# Patient Record
Sex: Male | Born: 1975 | Race: Black or African American | Hispanic: No | Marital: Married | Smoking: Never smoker
Health system: Southern US, Community
[De-identification: ages and names within clinical notes are randomized; demographics above are authoritative.]

## PROBLEM LIST (undated history)

## (undated) DIAGNOSIS — I10 Essential (primary) hypertension: Secondary | ICD-10-CM

## (undated) DIAGNOSIS — E785 Hyperlipidemia, unspecified: Secondary | ICD-10-CM

## (undated) DIAGNOSIS — E119 Type 2 diabetes mellitus without complications: Secondary | ICD-10-CM

## (undated) HISTORY — DX: Type 2 diabetes mellitus without complications: E11.9

## (undated) HISTORY — DX: Hyperlipidemia, unspecified: E78.5

## (undated) HISTORY — DX: Essential (primary) hypertension: I10

---

## 2015-02-05 ENCOUNTER — Encounter: Payer: Self-pay | Admitting: Internal Medicine

## 2015-03-27 ENCOUNTER — Encounter: Payer: Self-pay | Admitting: Endocrinology

## 2015-03-27 ENCOUNTER — Ambulatory Visit (INDEPENDENT_AMBULATORY_CARE_PROVIDER_SITE_OTHER): Payer: 59 | Admitting: Endocrinology

## 2015-03-27 VITALS — BP 122/84 | HR 92 | Temp 98.2°F | Ht 70.75 in | Wt 185.0 lb

## 2015-03-27 DIAGNOSIS — M5430 Sciatica, unspecified side: Secondary | ICD-10-CM | POA: Diagnosis not present

## 2015-03-27 DIAGNOSIS — I10 Essential (primary) hypertension: Secondary | ICD-10-CM

## 2015-03-27 DIAGNOSIS — Z794 Long term (current) use of insulin: Secondary | ICD-10-CM | POA: Diagnosis not present

## 2015-03-27 DIAGNOSIS — E785 Hyperlipidemia, unspecified: Secondary | ICD-10-CM | POA: Diagnosis not present

## 2015-03-27 DIAGNOSIS — E119 Type 2 diabetes mellitus without complications: Secondary | ICD-10-CM | POA: Diagnosis not present

## 2015-03-27 DIAGNOSIS — K219 Gastro-esophageal reflux disease without esophagitis: Secondary | ICD-10-CM

## 2015-03-27 LAB — POCT GLYCOSYLATED HEMOGLOBIN (HGB A1C): Hemoglobin A1C: 11.7

## 2015-03-27 MED ORDER — INSULIN GLARGINE 100 UNIT/ML SOLOSTAR PEN: 20.0000 [IU] | PEN_INJECTOR | SUBCUTANEOUS | Status: DC

## 2015-03-27 MED ORDER — GLUCOSE BLOOD VI STRP: 1.0000 | ORAL_STRIP | Freq: Two times a day (BID) | Status: DC

## 2015-03-27 NOTE — Progress Notes (Signed)
Subjective:    Patient ID: Anthony Arnold, male    DOB: 02-02-1976, 40 y.o.   MRN: [id number]  HPI pt states DM was dx'ed in 2014; he has mild if any neuropathy of the lower extremities; he is unaware of any associated chronic complications; he has never been on insulin; pt says his diet and exercise are not very good; he has never had pancreatitis, severe hypoglycemia or DKA.  He has not recently checked cbg.   No past medical history on file.  No past surgical history on file.  Social History   Social History  . Marital Status: Married    Spouse Name: N/A  . Number of Children: N/A  . Years of Education: N/A   Occupational History  . Not on file.   Social History Main Topics  . Smoking status: Never Smoker   . Smokeless tobacco: Not on file  . Alcohol Use: No  . Drug Use: Not on file  . Sexual Activity: Not on file   Other Topics Concern  . Not on file   Social History Narrative  . No narrative on file    No current outpatient prescriptions on file prior to visit.   No current facility-administered medications on file prior to visit.    No Known Allergies  Family History  Problem Relation Age of Onset  . Diabetes Mother   . Diabetes Brother     BP 122/84 mmHg  Pulse 92  Temp(Src) 98.2 F (36.8 C) (Oral)  Ht 5' 10.75" (1.797 m)  Wt 185 lb (83.915 kg)  BMI 25.99 kg/m2  SpO2 97%   Review of Systems denies weight loss, blurry vision, headache, chest pain, sob, n/v, muscle cramps, excessive diaphoresis, depression, cold intolerance, rhinorrhea, and easy bruising.   He has urinary frequency.       Objective:   Physical Exam VS: see vs page GEN: no distress HEAD: head: no deformity eyes: no periorbital swelling, no proptosis external nose and ears are normal mouth: no lesion seen NECK: supple, thyroid is not enlarged CHEST WALL: no deformity LUNGS: clear to auscultation BREASTS:  No gynecomastia CV: reg rate and rhythm, no murmur ABD: abdomen  is soft, nontender.  no hepatosplenomegaly.  not distended.  no hernia.   MUSCULOSKELETAL: muscle bulk and strength are grossly normal.  no obvious joint swelling.  gait is normal and steady EXTEMITIES: no deformity.  no ulcer on the feet.  feet are of normal color and temp.  no edema PULSES: dorsalis pedis intact bilat.  no carotid bruit NEURO:  cn 2-12 grossly intact.   readily moves all 4's.  sensation is intact to touch on the feet SKIN:  Normal texture and temperature.  No rash or suspicious lesion is visible.   NODES:  None palpable at the neck. PSYCH: alert, well-oriented.  Does not appear anxious nor depressed.   A1c=11.7%  I have reviewed outside records, and summarized: Pt was noted to have severely elevated a1c, and referred here.  i personally reviewed electrocardiogram tracing (today): Indication: DM Impression: normal     Assessment & Plan:  DM: severe exacerbation.  He agrees to start insulin.    Patient is advised the following: Patient Instructions  good diet and exercise significantly improve the control of your diabetes.  please let me know if you wish to be referred to a dietician.  high blood sugar is very risky to your health.  you should see an eye doctor and dentist every year.  It  is very important to get all recommended vaccinations.  controlling your blood pressure and cholesterol drastically reduces the damage diabetes does to your body.  Those who smoke should quit.  please discuss these with your doctor.  check your blood sugar twice a day.  vary the time of day when you check, between before the 3 meals, and at bedtime.  also check if you have symptoms of your blood sugar being too high or too low.  please keep a record of the readings and bring it to your next appointment here (or you can bring the meter itself).  You can write it on any piece of paper.  please call us sooner if your blood sugar goes below 70, or if you have a lot of readings over 200. i  have sent a prescription to your pharmacy, for the insulin.  Please call us in a few days, to tell us how the blood sugar is doing.   We'll refine the insulin schedule later.  Please come back for a follow-up appointment in 2 weeks.

## 2015-03-27 NOTE — Patient Instructions (Addendum)
good diet and exercise significantly improve the control of your diabetes.  please let me know if you wish to be referred to a dietician.  high blood sugar is very risky to your health.  you should see an eye doctor and dentist every year.  It is very important to get all recommended vaccinations.  controlling your blood pressure and cholesterol drastically reduces the damage diabetes does to your body.  Those who smoke should quit.  please discuss these with your doctor.  check your blood sugar twice a day.  vary the time of day when you check, between before the 3 meals, and at bedtime.  also check if you have symptoms of your blood sugar being too high or too low.  please keep a record of the readings and bring it to your next appointment here (or you can bring the meter itself).  You can write it on any piece of paper.  please call us sooner if your blood sugar goes below 70, or if you have a lot of readings over 200. i have sent a prescription to your pharmacy, for the insulin.  Please call us in a few days, to tell us how the blood sugar is doing.   We'll refine the insulin schedule later.  Please come back for a follow-up appointment in 2 weeks.

## 2015-03-28 DIAGNOSIS — I10 Essential (primary) hypertension: Secondary | ICD-10-CM | POA: Insufficient documentation

## 2015-03-28 DIAGNOSIS — M5430 Sciatica, unspecified side: Secondary | ICD-10-CM | POA: Insufficient documentation

## 2015-03-28 DIAGNOSIS — K219 Gastro-esophageal reflux disease without esophagitis: Secondary | ICD-10-CM | POA: Insufficient documentation

## 2015-03-28 DIAGNOSIS — E785 Hyperlipidemia, unspecified: Secondary | ICD-10-CM | POA: Insufficient documentation

## 2015-04-10 ENCOUNTER — Ambulatory Visit (INDEPENDENT_AMBULATORY_CARE_PROVIDER_SITE_OTHER): Payer: 59 | Admitting: Endocrinology

## 2015-04-10 VITALS — BP 136/94 | HR 94 | Temp 98.2°F | Ht 70.5 in | Wt 185.0 lb

## 2015-04-10 DIAGNOSIS — E119 Type 2 diabetes mellitus without complications: Secondary | ICD-10-CM | POA: Diagnosis not present

## 2015-04-10 DIAGNOSIS — Z794 Long term (current) use of insulin: Secondary | ICD-10-CM

## 2015-04-10 MED ORDER — INSULIN GLARGINE 100 UNIT/ML SOLOSTAR PEN: 25.0000 [IU] | PEN_INJECTOR | SUBCUTANEOUS | Status: DC

## 2015-04-10 NOTE — Patient Instructions (Addendum)
check your blood sugar twice a day.  vary the time of day when you check, between before the 3 meals, and at bedtime.  also check if you have symptoms of your blood sugar being too high or too low.  please keep a record of the readings and bring it to your next appointment here (or you can bring the meter itself).  You can write it on any piece of paper.  please call us sooner if your blood sugar goes below 70, or if you have a lot of readings over 200. Please stop taking the metformin, and: Increase the insulin to 25 units each morning.  On this type of insulin schedule, you should eat meals on a regular schedule.  If a meal is missed or significantly delayed, your blood sugar could go low. Please come back for a follow-up appointment in 1 month.

## 2015-04-10 NOTE — Progress Notes (Signed)
Subjective:    Patient ID: Anthony Arnold, male    DOB: 02-15-1976, 40 y.o.   MRN: [id number]  HPI Pt returns for f/u of diabetes mellitus: DM type: Insulin-requiring type 2 Dx'ed: 2014 Complications: none Therapy: insulin since 2017 DKA: never Severe hypoglycemia: never Pancreatitis: never Other: he takes qd insulin, at his request Interval history: he brings a record of his cbg's which i have reviewed today.  It varies from 126-240.  There is no trend throughout the day, except it is lowest in the afternoon.  He says his diet is better.   No past medical history on file.  No past surgical history on file.  Social History   Social History  . Marital Status: Married    Spouse Name: N/A  . Number of Children: N/A  . Years of Education: N/A   Occupational History  . Not on file.   Social History Main Topics  . Smoking status: Never Smoker   . Smokeless tobacco: Not on file  . Alcohol Use: No  . Drug Use: Not on file  . Sexual Activity: Not on file   Other Topics Concern  . Not on file   Social History Narrative  . No narrative on file    Current Outpatient Prescriptions on File Prior to Visit  Medication Sig Dispense Refill  . amLODipine (NORVASC) 5 MG tablet Take 5 mg by mouth daily.    Marland Kitchen aspirin 81 MG tablet Take 81 mg by mouth daily.    Marland Kitchen atorvastatin (LIPITOR) 80 MG tablet Take 80 mg by mouth daily.    Marland Kitchen glucose blood (ONE TOUCH ULTRA TEST) test strip 1 each by Other route 2 (two) times daily. And lancets 2/day 100 each 11  . lisinopril (PRINIVIL,ZESTRIL) 10 MG tablet Take 10 mg by mouth daily.     No current facility-administered medications on file prior to visit.    No Known Allergies  Family History  Problem Relation Age of Onset  . Diabetes Mother   . Diabetes Brother     BP 136/94 mmHg  Pulse 94  Temp(Src) 98.2 F (36.8 C) (Oral)  Ht 5' 10.5" (1.791 m)  Wt 185 lb (83.915 kg)  BMI 26.16 kg/m2  SpO2 95%  Review of Systems He denies  hypoglycemia    Objective:   Physical Exam VITAL SIGNS:  See vs page GENERAL: no distress SKIN:  Insulin injection sites at the anterior abdomen are normal      Assessment & Plan:  DM: he needs increased rx: I advised multiple daily injections, but he declines.    Patient is advised the following: Patient Instructions  check your blood sugar twice a day.  vary the time of day when you check, between before the 3 meals, and at bedtime.  also check if you have symptoms of your blood sugar being too high or too low.  please keep a record of the readings and bring it to your next appointment here (or you can bring the meter itself).  You can write it on any piece of paper.  please call us sooner if your blood sugar goes below 70, or if you have a lot of readings over 200. Please stop taking the metformin, and: Increase the insulin to 25 units each morning.  On this type of insulin schedule, you should eat meals on a regular schedule.  If a meal is missed or significantly delayed, your blood sugar could go low. Please come back for a follow-up appointment  in 1 month.

## 2015-05-07 NOTE — Progress Notes (Signed)
Subjective:    Patient ID: Anthony Arnold, male    DOB: [birth year], 40 y.o.   MRN: [id number]  HPI Pt returns for f/u of diabetes mellitus: DM type: Insulin-requiring type 2 Dx'ed: 2014 Complications: none Therapy: insulin since 2017 DKA: never Severe hypoglycemia: never Pancreatitis: never Other: he declines multiple daily injections.   Interval history: he brings a record of his cbg's which i have reviewed today.  It varies from 92-273.  There is no trend throughout the day, except it is lowest in the afternoon, after he misses lunch.  No past medical history on file.  No past surgical history on file.  Social History   Social History  . Marital Status: Married    Spouse Name: N/A  . Number of Children: N/A  . Years of Education: N/A   Occupational History  . Not on file.   Social History Main Topics  . Smoking status: Never Smoker   . Smokeless tobacco: Not on file  . Alcohol Use: No  . Drug Use: Not on file  . Sexual Activity: Not on file   Other Topics Concern  . Not on file   Social History Narrative    Current Outpatient Prescriptions on File Prior to Visit  Medication Sig Dispense Refill  . amLODipine (NORVASC) 5 MG tablet Take 5 mg by mouth daily.    Marland Kitchen aspirin 81 MG tablet Take 81 mg by mouth daily.    Marland Kitchen atorvastatin (LIPITOR) 80 MG tablet Take 80 mg by mouth daily.    Marland Kitchen glucose blood (ONE TOUCH ULTRA TEST) test strip 1 each by Other route 2 (two) times daily. And lancets 2/day 100 each 11  . Insulin Glargine (BASAGLAR KWIKPEN) 100 UNIT/ML Solostar Pen Inject 25 Units into the skin every morning. And pen needles 1/day 15 mL 11  . lisinopril (PRINIVIL,ZESTRIL) 10 MG tablet Take 10 mg by mouth daily.     No current facility-administered medications on file prior to visit.    No Known Allergies  Family History  Problem Relation Age of Onset  . Diabetes Mother   . Diabetes Brother     BP 132/90 mmHg  Pulse 88  Temp(Src) 98 F (36.7 C) (Oral)   Resp 20  Ht 5' 10.5" (1.791 m)  Wt 188 lb 9.6 oz (85.548 kg)  BMI 26.67 kg/m2  SpO2 99%  Review of Systems He denies hypoglycemia    Objective:   Physical Exam VITAL SIGNS:  See vs page GENERAL: no distress Pulses: dorsalis pedis intact bilat.   MSK: no deformity of the feet CV: no leg edema Skin:  no ulcer on the feet.  normal color and temp on the feet. Neuro: sensation is intact to touch on the feet     Assessment & Plan:  DM: control is improved, but we'll check fructosamine.   Patient is advised the following: Patient Instructions  check your blood sugar twice a day.  vary the time of day when you check, between before the 3 meals, and at bedtime.  also check if you have symptoms of your blood sugar being too high or too low.  please keep a record of the readings and bring it to your next appointment here (or you can bring the meter itself).  You can write it on any piece of paper.  please call us sooner if your blood sugar goes below 70, or if you have a lot of readings over 200. blood tests are requested for you today.  We'll let you know about the results.  On this type of insulin schedule, you should eat meals on a regular schedule.  If a meal is missed or significantly delayed, your blood sugar could go low. Please come back for a follow-up appointment in 2 months.

## 2015-05-07 NOTE — Patient Instructions (Addendum)
check your blood sugar twice a day.  vary the time of day when you check, between before the 3 meals, and at bedtime.  also check if you have symptoms of your blood sugar being too high or too low.  please keep a record of the readings and bring it to your next appointment here (or you can bring the meter itself).  You can write it on any piece of paper.  please call us sooner if your blood sugar goes below 70, or if you have a lot of readings over 200. blood tests are requested for you today.  We'll let you know about the results.  On this type of insulin schedule, you should eat meals on a regular schedule.  If a meal is missed or significantly delayed, your blood sugar could go low. Please come back for a follow-up appointment in 2 months.

## 2015-05-08 ENCOUNTER — Ambulatory Visit (INDEPENDENT_AMBULATORY_CARE_PROVIDER_SITE_OTHER): Payer: 59 | Admitting: Endocrinology

## 2015-05-08 ENCOUNTER — Encounter: Payer: Self-pay | Admitting: Endocrinology

## 2015-05-08 VITALS — BP 132/90 | HR 88 | Temp 98.0°F | Resp 20 | Ht 70.5 in | Wt 188.6 lb

## 2015-05-08 DIAGNOSIS — Z794 Long term (current) use of insulin: Secondary | ICD-10-CM

## 2015-05-08 DIAGNOSIS — E119 Type 2 diabetes mellitus without complications: Secondary | ICD-10-CM | POA: Diagnosis not present

## 2015-05-08 NOTE — Progress Notes (Signed)
Pre visit review using our clinic review tool, if applicable. No additional management support is needed unless otherwise documented below in the visit note. 

## 2015-05-08 NOTE — Addendum Note (Signed)
Addended by: Adline MangoSTONE-ELMORE, Mattix Imhof I on: 05/08/2015 08:22 AM   Modules accepted: Orders

## 2015-05-09 LAB — FRUCTOSAMINE: FRUCTOSAMINE: 442 umol/L — AB (ref 0–285)

## 2015-06-05 ENCOUNTER — Telehealth: Payer: Self-pay | Admitting: Endocrinology

## 2015-06-05 MED ORDER — BASAGLAR KWIKPEN 100 UNIT/ML ~~LOC~~ SOPN: 25.0000 [IU] | PEN_INJECTOR | Freq: Every day | SUBCUTANEOUS | Status: DC

## 2015-06-05 NOTE — Telephone Encounter (Signed)
I contacted the pt and advised Anthony Arnold is ok to replace Lantus. Rx has been sent per pt's request. Pt voiced understanding,

## 2015-06-05 NOTE — Telephone Encounter (Signed)
Pt calling to let us know the equivalent for the lantus solostar is basaglar can we call this in if you are ok with this med  If this is ok call [facility] into [facility] on [address]

## 2015-07-12 ENCOUNTER — Ambulatory Visit: Payer: 59 | Admitting: Endocrinology

## 2015-07-25 ENCOUNTER — Encounter: Payer: Self-pay | Admitting: Endocrinology

## 2015-07-25 ENCOUNTER — Ambulatory Visit (INDEPENDENT_AMBULATORY_CARE_PROVIDER_SITE_OTHER): Payer: 59 | Admitting: Endocrinology

## 2015-07-25 VITALS — BP 144/102 | HR 90 | Temp 98.2°F | Ht 70.5 in | Wt 191.0 lb

## 2015-07-25 DIAGNOSIS — Z794 Long term (current) use of insulin: Secondary | ICD-10-CM | POA: Diagnosis not present

## 2015-07-25 DIAGNOSIS — E119 Type 2 diabetes mellitus without complications: Secondary | ICD-10-CM

## 2015-07-25 LAB — POCT GLYCOSYLATED HEMOGLOBIN (HGB A1C): HEMOGLOBIN A1C: 8.7

## 2015-07-25 MED ORDER — BASAGLAR KWIKPEN 100 UNIT/ML ~~LOC~~ SOPN: 35.0000 [IU] | PEN_INJECTOR | SUBCUTANEOUS | Status: DC

## 2015-07-25 NOTE — Progress Notes (Signed)
Subjective:    Patient ID: Anthony Arnold, male    DOB: 1975/06/19, 40 y.o.   MRN: [id number]  HPI Pt returns for f/u of diabetes mellitus: DM type: Insulin-requiring type 2 Dx'ed: 2014 Complications: none Therapy: insulin since 2017 DKA: never Severe hypoglycemia: never Pancreatitis: never Other: he declines multiple daily injections.   Interval history: no cbg record, but states cbg's vary from 76-162.  There is no trend throughout the day, except it is lowest after a missed meal.  pt states he feels well in general.  No past medical history on file.  No past surgical history on file.  Social History   Social History  . Marital Status: Married    Spouse Name: N/A  . Number of Children: N/A  . Years of Education: N/A   Occupational History  . Not on file.   Social History Main Topics  . Smoking status: Never Smoker   . Smokeless tobacco: Not on file  . Alcohol Use: No  . Drug Use: Not on file  . Sexual Activity: Not on file   Other Topics Concern  . Not on file   Social History Narrative    Current Outpatient Prescriptions on File Prior to Visit  Medication Sig Dispense Refill  . amLODipine (NORVASC) 5 MG tablet Take 5 mg by mouth daily.    Marland Kitchen. aspirin 81 MG tablet Take 81 mg by mouth daily.    Marland Kitchen. atorvastatin (LIPITOR) 80 MG tablet Take 80 mg by mouth daily.    Marland Kitchen. glucose blood (ONE TOUCH ULTRA TEST) test strip 1 each by Other route 2 (two) times daily. And lancets 2/day 100 each 11  . lisinopril (PRINIVIL,ZESTRIL) 10 MG tablet Take 10 mg by mouth daily.     No current facility-administered medications on file prior to visit.    No Known Allergies  Family History  Problem Relation Age of Onset  . Diabetes Mother   . Diabetes Brother     BP 144/102 mmHg  Pulse 90  Temp(Src) 98.2 F (36.8 C) (Oral)  Ht 5' 10.5" (1.791 m)  Wt 191 lb (86.637 kg)  BMI 27.01 kg/m2  SpO2 96%  Review of Systems He denies hypoglycemia.      Objective:   Physical  Exam VITAL SIGNS:  See vs page.  GENERAL: no distress Pulses: dorsalis pedis intact bilat.   MSK: no deformity of the feet.  CV: no leg edema.  Skin:  no ulcer on the feet.  normal color and temp on the feet.  Neuro: sensation is intact to touch on the feet.     Lab Results  Component Value Date   HGBA1C 8.7 07/25/2015      Assessment & Plan:  Insulin-requiring type 2 DM.  He needs increased rx HTN: with ? of situational component.    Patient is advised the following: Patient Instructions  check your blood sugar twice a day.  vary the time of day when you check, between before the 3 meals, and at bedtime.  also check if you have symptoms of your blood sugar being too high or too low.  please keep a record of the readings and bring it to your next appointment here (or you can bring the meter itself).  You can write it on any piece of paper.  please call us sooner if your blood sugar goes below 70, or if you have a lot of readings over 200. Please increase the insulin to 35 units each morning.  On  this type of insulin schedule, you should eat meals on a regular schedule.  If a meal is missed or significantly delayed, your blood sugar could go low. Please come back for a follow-up appointment in 3 months.   Please have your blood pressure rechecked soon, with Dr Kateri Plummer.  Romero Belling, MD

## 2015-07-25 NOTE — Patient Instructions (Addendum)
check your blood sugar twice a day.  vary the time of day when you check, between before the 3 meals, and at bedtime.  also check if you have symptoms of your blood sugar being too high or too low.  please keep a record of the readings and bring it to your next appointment here (or you can bring the meter itself).  You can write it on any piece of paper.  please call us sooner if your blood sugar goes below 70, or if you have a lot of readings over 200. Please increase the insulin to 35 units each morning.  On this type of insulin schedule, you should eat meals on a regular schedule.  If a meal is missed or significantly delayed, your blood sugar could go low. Please come back for a follow-up appointment in 3 months.   Please have your blood pressure rechecked soon, with Dr Kateri PlummerMorrow.

## 2015-10-24 ENCOUNTER — Ambulatory Visit: Payer: 59 | Admitting: Endocrinology

## 2016-03-23 ENCOUNTER — Other Ambulatory Visit: Payer: Self-pay | Admitting: Endocrinology

## 2016-03-23 NOTE — Telephone Encounter (Signed)
Please refill x 1 Ov is due  

## 2016-07-16 ENCOUNTER — Other Ambulatory Visit: Payer: Self-pay | Admitting: Endocrinology

## 2016-07-16 NOTE — Telephone Encounter (Signed)
Please refill x 3 mos Ov is due 

## 2016-07-30 ENCOUNTER — Ambulatory Visit (INDEPENDENT_AMBULATORY_CARE_PROVIDER_SITE_OTHER): Payer: 59 | Admitting: Endocrinology

## 2016-07-30 ENCOUNTER — Encounter: Payer: Self-pay | Admitting: Endocrinology

## 2016-07-30 VITALS — BP 148/96 | HR 90 | Ht 70.0 in | Wt 195.0 lb

## 2016-07-30 DIAGNOSIS — Z794 Long term (current) use of insulin: Secondary | ICD-10-CM | POA: Diagnosis not present

## 2016-07-30 DIAGNOSIS — E119 Type 2 diabetes mellitus without complications: Secondary | ICD-10-CM | POA: Diagnosis not present

## 2016-07-30 LAB — POCT GLYCOSYLATED HEMOGLOBIN (HGB A1C): Hemoglobin A1C: 10.8

## 2016-07-30 MED ORDER — GLUCOSE BLOOD VI STRP: 1.0000 | ORAL_STRIP | Freq: Two times a day (BID) | 3 refills | Status: AC

## 2016-07-30 MED ORDER — BASAGLAR KWIKPEN 100 UNIT/ML ~~LOC~~ SOPN: 45.0000 [IU] | PEN_INJECTOR | SUBCUTANEOUS | 0 refills | Status: DC

## 2016-07-30 NOTE — Patient Instructions (Addendum)
check your blood sugar twice a day.  vary the time of day when you check, between before the 3 meals, and at bedtime.  also check if you have symptoms of your blood sugar being too high or too low.  please keep a record of the readings and bring it to your next appointment here (or you can bring the meter itself).  You can write it on any piece of paper.  please call us sooner if your blood sugar goes below 70, or if you have a lot of readings over 200. Please increase the insulin to 45 units each morning.  On this type of insulin schedule, you should eat meals on a regular schedule.  If a meal is missed or significantly delayed, your blood sugar could go low. Please have your blood pressure rechecked soon with Dr Kateri PlummerMorrow.   Please come back for a follow-up appointment in 3 months.

## 2016-07-30 NOTE — Progress Notes (Signed)
Subjective:    Patient ID: Anthony Arnold, male    DOB: 11/20/1975, 41 y.o.   MRN: [id number]  HPI Pt returns for f/u of diabetes mellitus: DM type: Insulin-requiring type 2 Dx'ed: 2014 Complications: none Therapy: insulin since 2017 DKA: never Severe hypoglycemia: never Pancreatitis: never Other: he declines multiple daily injections.   Interval history: no cbg record, but states cbg's vary from 111-250.  There is no trend throughout the day.  pt states he feels well in general.   Past Medical History:  Diagnosis Date  . Diabetes (HCC)   . Dyslipidemia   . HTN (hypertension)     No past surgical history on file.  Social History   Social History  . Marital status: Married    Spouse name: N/A  . Number of children: N/A  . Years of education: N/A   Occupational History  . Not on file.   Social History Main Topics  . Smoking status: Never Smoker  . Smokeless tobacco: Never Used  . Alcohol use No  . Drug use: Unknown  . Sexual activity: Not on file   Other Topics Concern  . Not on file   Social History Narrative  . No narrative on file    Current Outpatient Prescriptions on File Prior to Visit  Medication Sig Dispense Refill  . amLODipine (NORVASC) 5 MG tablet Take 5 mg by mouth daily.    Marland Kitchen aspirin 81 MG tablet Take 81 mg by mouth daily.    Marland Kitchen atorvastatin (LIPITOR) 80 MG tablet Take 80 mg by mouth daily.    . BD PEN NEEDLE NANO U/F 32G X 4 MM MISC USE ONCE DAILY 100 each 0  . lisinopril (PRINIVIL,ZESTRIL) 10 MG tablet Take 10 mg by mouth daily.     No current facility-administered medications on file prior to visit.     No Known Allergies  Family History  Problem Relation Age of Onset  . Diabetes Mother   . Diabetes Brother     BP (!) 148/96   Pulse 90   Ht 5\' 10"  (1.778 m)   Wt 195 lb (88.5 kg)   SpO2 97%   BMI 27.98 kg/m    Review of Systems He denies hypoglycemia.      Objective:   Physical Exam VITAL SIGNS:  See vs page GENERAL:  no distress Pulses: dorsalis pedis intact bilat.   MSK: no deformity of the feet.  CV: no leg edema.  Skin:  no ulcer on the feet.  normal color and temp on the feet.  Neuro: sensation is intact to touch on the feet.   Lab Results  Component Value Date   HGBA1C 10.8 07/30/2016      Assessment & Plan:  Insulin-requiring type 2 DM:  He needs increased rx.  He may need a faster-acting qd insulin, but he declines to change, at least for now HTN: persistent despite rx.   Patient Instructions  check your blood sugar twice a day.  vary the time of day when you check, between before the 3 meals, and at bedtime.  also check if you have symptoms of your blood sugar being too high or too low.  please keep a record of the readings and bring it to your next appointment here (or you can bring the meter itself).  You can write it on any piece of paper.  please call us sooner if your blood sugar goes below 70, or if you have a lot of readings over 200.  Please increase the insulin to 45 units each morning.  On this type of insulin schedule, you should eat meals on a regular schedule.  If a meal is missed or significantly delayed, your blood sugar could go low. Please have your blood pressure rechecked soon with Dr Kateri PlummerMorrow.   Please come back for a follow-up appointment in 3 months.

## 2016-08-14 ENCOUNTER — Other Ambulatory Visit: Payer: Self-pay | Admitting: Endocrinology

## 2016-10-30 ENCOUNTER — Ambulatory Visit: Payer: 59 | Admitting: Endocrinology

## 2016-10-30 DIAGNOSIS — Z0289 Encounter for other administrative examinations: Secondary | ICD-10-CM

## 2016-11-13 ENCOUNTER — Other Ambulatory Visit: Payer: Self-pay | Admitting: Endocrinology

## 2018-09-04 ENCOUNTER — Other Ambulatory Visit: Payer: Self-pay

## 2018-09-04 DIAGNOSIS — Z20822 Contact with and (suspected) exposure to covid-19: Secondary | ICD-10-CM

## 2018-09-10 LAB — NOVEL CORONAVIRUS, NAA: SARS-CoV-2, NAA: NOT DETECTED

## 2018-09-14 ENCOUNTER — Telehealth: Payer: Self-pay | Admitting: Family Medicine

## 2018-09-14 NOTE — Telephone Encounter (Signed)
Pt given covid-19(not detected) result / Pt verbalized understanding  °

## 2019-01-01 ENCOUNTER — Other Ambulatory Visit: Payer: Self-pay

## 2019-01-01 DIAGNOSIS — Z20822 Contact with and (suspected) exposure to covid-19: Secondary | ICD-10-CM

## 2019-01-03 LAB — NOVEL CORONAVIRUS, NAA: SARS-CoV-2, NAA: NOT DETECTED

## 2019-03-03 ENCOUNTER — Ambulatory Visit: Payer: 59 | Attending: Internal Medicine

## 2019-03-03 DIAGNOSIS — Z20822 Contact with and (suspected) exposure to covid-19: Secondary | ICD-10-CM

## 2019-03-05 LAB — NOVEL CORONAVIRUS, NAA: SARS-CoV-2, NAA: DETECTED — AB

## 2020-01-14 ENCOUNTER — Other Ambulatory Visit: Payer: 59

## 2020-01-14 DIAGNOSIS — Z20822 Contact with and (suspected) exposure to covid-19: Secondary | ICD-10-CM

## 2020-01-15 LAB — NOVEL CORONAVIRUS, NAA: SARS-CoV-2, NAA: NOT DETECTED

## 2020-01-15 LAB — SARS-COV-2, NAA 2 DAY TAT

## 2020-02-08 ENCOUNTER — Other Ambulatory Visit: Payer: 59

## 2020-02-08 DIAGNOSIS — Z20822 Contact with and (suspected) exposure to covid-19: Secondary | ICD-10-CM

## 2020-02-10 ENCOUNTER — Ambulatory Visit: Payer: 59 | Attending: Internal Medicine

## 2020-02-10 DIAGNOSIS — Z23 Encounter for immunization: Secondary | ICD-10-CM

## 2020-02-10 LAB — SARS-COV-2, NAA 2 DAY TAT

## 2020-02-10 LAB — NOVEL CORONAVIRUS, NAA: SARS-CoV-2, NAA: NOT DETECTED

## 2020-02-10 NOTE — Progress Notes (Signed)
   Covid-19 Vaccination Clinic  Name:  Anthony Arnold    MRN: [id number] DOB: [birth year]  02/10/2020  [name] was observed post Covid-19 immunization for 15 minutes without incident. He was provided with Vaccine Information Sheet and instruction to access the V-Safe system.   [name] was instructed to call 911 with any severe reactions post vaccine: Marland Kitchen Difficulty breathing  . Swelling of face and throat  . A fast heartbeat  . A bad rash all over body  . Dizziness and weakness   Immunizations Administered    Name Date Dose VIS Date Route   Pfizer COVID-19 Vaccine 02/10/2020  2:38 PM 0.3 mL 12/14/2019 Intramuscular   Manufacturer: [employer], [employer]   Lot: [id number]   NDC: [address]-4

## 2020-04-25 ENCOUNTER — Telehealth: Payer: Self-pay | Admitting: Hematology and Oncology

## 2020-04-25 NOTE — Telephone Encounter (Signed)
Received anew hem referral from [facility] at [facility] for elevated blood protein. Anthony Arnold returned my call and has been scheduled to see Dr. Al Pimple on 3/16 at 9am. Pt aware to arrive 20 minutes early.

## 2020-04-25 NOTE — Telephone Encounter (Signed)
Received anew hem referral from [facility] at [facility] for elevated blood protein. Anthony Arnold returned my call and has been scheduled to see Dr. Al Pimple on 3/16

## 2020-05-09 ENCOUNTER — Encounter: Payer: Self-pay | Admitting: Hematology and Oncology

## 2020-05-09 ENCOUNTER — Other Ambulatory Visit: Payer: Self-pay

## 2020-05-09 ENCOUNTER — Inpatient Hospital Stay: Payer: 59 | Attending: Hematology and Oncology | Admitting: Hematology and Oncology

## 2020-05-09 ENCOUNTER — Inpatient Hospital Stay: Payer: 59

## 2020-05-09 ENCOUNTER — Telehealth: Payer: Self-pay | Admitting: Hematology and Oncology

## 2020-05-09 VITALS — BP 151/95 | HR 110 | Temp 97.0°F | Resp 18 | Ht 69.0 in | Wt 181.0 lb

## 2020-05-09 DIAGNOSIS — I10 Essential (primary) hypertension: Secondary | ICD-10-CM | POA: Diagnosis not present

## 2020-05-09 DIAGNOSIS — E119 Type 2 diabetes mellitus without complications: Secondary | ICD-10-CM

## 2020-05-09 DIAGNOSIS — R748 Abnormal levels of other serum enzymes: Secondary | ICD-10-CM | POA: Diagnosis not present

## 2020-05-09 DIAGNOSIS — E785 Hyperlipidemia, unspecified: Secondary | ICD-10-CM | POA: Diagnosis not present

## 2020-05-09 DIAGNOSIS — E8809 Other disorders of plasma-protein metabolism, not elsewhere classified: Secondary | ICD-10-CM

## 2020-05-09 LAB — CBC WITH DIFFERENTIAL/PLATELET
Abs Immature Granulocytes: 0.01 10*3/uL (ref 0.00–0.07)
Basophils Absolute: 0.1 10*3/uL (ref 0.0–0.1)
Basophils Relative: 1 %
Eosinophils Absolute: 0.4 10*3/uL (ref 0.0–0.5)
Eosinophils Relative: 6 %
HCT: 46.1 % (ref 39.0–52.0)
Hemoglobin: 15.1 g/dL (ref 13.0–17.0)
Immature Granulocytes: 0 %
Lymphocytes Relative: 25 %
Lymphs Abs: 1.7 10*3/uL (ref 0.7–4.0)
MCH: 26.7 pg (ref 26.0–34.0)
MCHC: 32.8 g/dL (ref 30.0–36.0)
MCV: 81.6 fL (ref 80.0–100.0)
Monocytes Absolute: 0.4 10*3/uL (ref 0.1–1.0)
Monocytes Relative: 6 %
Neutro Abs: 4.3 10*3/uL (ref 1.7–7.7)
Neutrophils Relative %: 62 %
Platelets: 232 10*3/uL (ref 150–400)
RBC: 5.65 MIL/uL (ref 4.22–5.81)
RDW: 14.2 % (ref 11.5–15.5)
WBC: 6.8 10*3/uL (ref 4.0–10.5)
nRBC: 0 % (ref 0.0–0.2)

## 2020-05-09 LAB — CMP (CANCER CENTER ONLY)
ALT: 23 U/L (ref 0–44)
AST: 20 U/L (ref 15–41)
Albumin: 4.8 g/dL (ref 3.5–5.0)
Alkaline Phosphatase: 168 U/L — ABNORMAL HIGH (ref 38–126)
Anion gap: 10 (ref 5–15)
BUN: 12 mg/dL (ref 6–20)
CO2: 27 mmol/L (ref 22–32)
Calcium: 9.9 mg/dL (ref 8.9–10.3)
Chloride: 101 mmol/L (ref 98–111)
Creatinine: 1.27 mg/dL — ABNORMAL HIGH (ref 0.61–1.24)
GFR, Estimated: >60 mL/min (ref >60)
Glucose, Bld: 203 mg/dL — ABNORMAL HIGH (ref 70–99)
Potassium: 4.1 mmol/L (ref 3.5–5.1)
Sodium: 138 mmol/L (ref 135–145)
Total Bilirubin: 0.8 mg/dL (ref 0.3–1.2)
Total Protein: 9.4 g/dL — ABNORMAL HIGH (ref 6.5–8.1)

## 2020-05-09 NOTE — Progress Notes (Signed)
[facility] CONSULT NOTE  Patient Care Team: Farris Has, MD as PCP - General (Family Medicine)  CHIEF COMPLAINTS/PURPOSE OF CONSULTATION:  Elevated total protein  ASSESSMENT & PLAN:  No problem-specific Assessment & Plan notes found for this encounter.  No orders of the defined types were placed in this encounter.  This is a very pleasant 45 year old male patient with past medical history significant for type 2 diabetes, hypertension, dyslipidemia referred for evaluation of elevated total protein, slightly elevated calcium and elevated alkaline phosphatase.  He had serum protein electrophoresis which did not show any evidence of monoclonal protein. Physical examination unremarkable, no palpable lymphadenopathy or hepatosplenomegaly. I reviewed his labs which showed slightly elevated total protein, albumin, slightly elevated alkaline phosphatase, normal creatinine and slightly elevated calcium. SPEP is negative for monoclonal protein.  I recommended we proceed with UPEP, CBC, kappa lambda light chain, CMP and if he has no evidence of monoclonal gammopathy on these labs, he can be safely discharged back to his primary care physician.  With regards to his slightly elevated alkaline phosphatase, I do not know the chronicity of this issue, this can be monitored by his primary care physician.  Family history of prostate cancer in dad, explained that his risk of prostate cancer is higher than general population because of first-degree relative with prostate cancer.  He undergoes digital rectal exam and PSA every year by his PCP. He is up-to-date with age-appropriate cancer screening, has a colonoscopy upcoming. Type 2 diabetes, on insulin and Metformin, follows up with primary care physician.  HISTORY OF PRESENTING ILLNESS:   Anthony Arnold 45 y.o. male is here because of elevated total protein.  This is a very pleasant 45 year old healthy male patient with a past medical  history significant for type 2 diabetes mellitus, hypertension, dyslipidemia referred to hematology for some abnormal labs.  Anthony Arnold arrived to appointment today by himself.  He denies any new health complaints.  He is working on his diabetes control.  He may have lost some weight but gradually better control of his diabetes.  No unintentional weight loss.  No change in breathing.  He occasionally has some diarrhea from Metformin but otherwise no complaints.  No change in urinary habits.  No new neurological complaints. Rest of the pertinent 10 point ROS reviewed and negative.  REVIEW OF SYSTEMS:   Constitutional: Denies fevers, chills or abnormal night sweats Eyes: Denies blurriness of vision, double vision or watery eyes Ears, nose, mouth, throat, and face: Denies mucositis or sore throat Respiratory: Denies cough, dyspnea or wheezes Cardiovascular: Denies palpitation, chest discomfort or lower extremity swelling Gastrointestinal:  Denies nausea, heartburn or change in bowel habits Skin: Denies abnormal skin rashes Lymphatics: Denies new lymphadenopathy or easy bruising Neurological:Denies numbness, tingling or new weaknesses Behavioral/Psych: Mood is stable, no new changes  All other systems were reviewed with the patient and are negative.  MEDICAL HISTORY:  Past Medical History:  Diagnosis Date  . Diabetes (HCC)   . Dyslipidemia   . HTN (hypertension)     SURGICAL HISTORY: No past surgical history  SOCIAL HISTORY: Social History   Socioeconomic History  . Marital status: Married    Spouse name: Not on file  . Number of children: Not on file  . Years of education: Not on file  . Highest education level: Not on file  Occupational History  . Not on file  Tobacco Use  . Smoking status: Never Smoker  . Smokeless tobacco: Never Used  Substance and Sexual  Activity  . Alcohol use: No    Alcohol/week: 0.0 standard drinks  . Drug use: Not on file  . Sexual activity: Not on  file  Other Topics Concern  . Not on file  Social History Narrative  . Not on file   Social Determinants of Health   Financial Resource Strain: Not on file  Food Insecurity: Not on file  Transportation Needs: Not on file  Physical Activity: Not on file  Stress: Not on file  Social Connections: Not on file  Intimate Partner Violence: Not on file    FAMILY HISTORY: Family History  Problem Relation Age of Onset  . Diabetes Mother   . Diabetes Brother   Dad had prostate cancer. Her paternal aunt had breast cancer in her mid 79s. Paternal grandfather had melanoma.  ALLERGIES:  has No Known Allergies.  MEDICATIONS:  Current Outpatient Medications  Medication Sig Dispense Refill  . amLODipine (NORVASC) 5 MG tablet Take 5 mg by mouth daily.    Marland Kitchen aspirin 81 MG tablet Take 81 mg by mouth daily.    Marland Kitchen glucose blood (ONE TOUCH ULTRA TEST) test strip 1 each by Other route 2 (two) times daily. And lancets 2/day 100 each 3  . lisinopril (PRINIVIL,ZESTRIL) 10 MG tablet Take 10 mg by mouth daily.    . metFORMIN (GLUCOPHAGE-XR) 750 MG 24 hr tablet 1 tablet with evening meal    . rosuvastatin (CRESTOR) 20 MG tablet 1 tablet    . atorvastatin (LIPITOR) 80 MG tablet Take 80 mg by mouth daily. (Patient not taking: Reported on 05/09/2020)    . BD PEN NEEDLE NANO U/F 32G X 4 MM MISC USE ONCE DAILY (Patient not taking: Reported on 05/09/2020) 100 each 0  . Insulin Glargine (BASAGLAR KWIKPEN) 100 UNIT/ML SOPN Inject 0.45 mLs (45 Units total) into the skin every morning. And pen needles 1/day (Patient not taking: Reported on 05/09/2020) 15 pen 0  . Insulin Glargine (BASAGLAR KWIKPEN) 100 UNIT/ML SOPN INJECT 35 UNITS INTO THE SKIN EVERY MORNING. (Patient not taking: Reported on 05/09/2020) 15 mL 2  . Insulin Glargine (BASAGLAR KWIKPEN) 100 UNIT/ML SOPN INJECT 45 UNITSINTO THE SKIN EVERY MORNING (Patient not taking: Reported on 05/09/2020) 15 pen 4  . insulin lispro (HUMALOG KWIKPEN) 100 UNIT/ML KwikPen 5 to  8 units     No current facility-administered medications for this visit.    PHYSICAL EXAMINATION:  ECOG PERFORMANCE STATUS: 0 - Asymptomatic  Vitals:   05/09/20 0900  BP: (!) 151/95  Pulse: (!) 110  Resp: 18  Temp: (!) 97 F (36.1 C)  SpO2: 100%   Filed Weights   05/09/20 0900  Weight: 181 lb (82.1 kg)    GENERAL:alert, no distress and comfortable SKIN: skin color, texture, turgor are normal, no rashes or significant lesions EYES: normal, conjunctiva are pink and non-injected, sclera clear OROPHARYNX:no exudate, no erythema and lips, buccal mucosa, and tongue normal  NECK: supple, thyroid normal size, non-tender, without nodularity LYMPH:  no palpable lymphadenopathy in the cervical, axillary or inguinal LUNGS: clear to auscultation and percussion with normal breathing effort HEART: regular rate & rhythm and no murmurs and no lower extremity edema ABDOMEN:abdomen soft, non-tender and normal bowel sounds Musculoskeletal:no cyanosis of digits and no clubbing  PSYCH: alert & oriented x 3 with fluent speech NEURO: no focal motor/sensory deficits  LABORATORY DATA:  I have reviewed the data as listed No results found for: WBC, HGB, HCT, MCV, PLT   Chemistry   No results found for:  NA, K, CL, CO2, BUN, CREATININE, GLU No results found for: CALCIUM, ALKPHOS, AST, ALT, BILITOT   I reviewed available labs.  No elevation in creatinine.  Elevated total protein and albumin, mildly elevated calcium and elevated alkaline phosphatase.  SPEP without any monoclonal protein.  RADIOGRAPHIC STUDIES: I have personally reviewed the radiological images as listed and agreed with the findings in the report. No results found.  All questions were answered. The patient knows to call the clinic with any problems, questions or concerns. I spent 30 minutes in the care of this patient including H and P, review of records, counseling and coordination of care.     Rachel Moulds, MD 05/09/2020 9:38  AM

## 2020-05-09 NOTE — Telephone Encounter (Signed)
Scheduled per los. Gave avs and calendar  

## 2020-05-10 LAB — KAPPA/LAMBDA LIGHT CHAINS
Kappa free light chain: 20.7 mg/L — ABNORMAL HIGH (ref 3.3–19.4)
Kappa, lambda light chain ratio: 1.5 (ref 0.26–1.65)
Lambda free light chains: 13.8 mg/L (ref 5.7–26.3)

## 2020-05-11 DIAGNOSIS — R748 Abnormal levels of other serum enzymes: Secondary | ICD-10-CM | POA: Diagnosis not present

## 2020-05-11 LAB — PROTEIN ELECTROPHORESIS, SERUM, WITH REFLEX
A/G Ratio: 1 (ref 0.7–1.7)
Albumin ELP: 4.3 g/dL (ref 2.9–4.4)
Alpha-1-Globulin: 0.2 g/dL (ref 0.0–0.4)
Alpha-2-Globulin: 0.5 g/dL (ref 0.4–1.0)
Beta Globulin: 1.5 g/dL — ABNORMAL HIGH (ref 0.7–1.3)
Gamma Globulin: 2 g/dL — ABNORMAL HIGH (ref 0.4–1.8)
Globulin, Total: 4.2 g/dL — ABNORMAL HIGH (ref 2.2–3.9)
Total Protein ELP: 8.5 g/dL (ref 6.0–8.5)

## 2020-05-14 LAB — UPEP/TP, 24-HR URINE
Albumin, U: 100 %
Alpha 1, Urine: 0 %
Alpha 2, Urine: 0 %
Beta, Urine: 0 %
Gamma Globulin, Urine: 0 %
Total Protein, Urine-Ur/day: 138 mg/24 hr (ref 30–150)
Total Protein, Urine: 10.2 mg/dL
Total Volume: 1350

## 2020-05-23 ENCOUNTER — Encounter: Payer: Self-pay | Admitting: Hematology and Oncology

## 2020-05-23 ENCOUNTER — Inpatient Hospital Stay (HOSPITAL_BASED_OUTPATIENT_CLINIC_OR_DEPARTMENT_OTHER): Payer: 59 | Admitting: Hematology and Oncology

## 2020-05-23 DIAGNOSIS — Z8042 Family history of malignant neoplasm of prostate: Secondary | ICD-10-CM

## 2020-05-23 DIAGNOSIS — R748 Abnormal levels of other serum enzymes: Secondary | ICD-10-CM

## 2020-05-23 NOTE — Assessment & Plan Note (Signed)
This is a very pleasant 45 year old male patient with past medical history significant for type 2 diabetes, hypertension, dyslipidemia referred for evaluation of elevated total protein, slightly elevated calcium and elevated alkaline phosphatase.  He had serum protein electrophoresis which did not show any evidence of monoclonal protein. Physical examination unremarkable, no palpable lymphadenopathy or hepatosplenomegaly. I reviewed his labs which showed slightly elevated total protein, albumin, slightly elevated alkaline phosphatase, normal creatinine and slightly elevated calcium. SPEP is negative for monoclonal protein.  UPEP, K/L ratio normal. We discussed about doing a bone survey since there is persistently elevated alk phosphatase. He was given the number to call to schedule his imaging. RTC in 4 weeks to review results If bone survey is normal, he can be discharged back to primary physician.

## 2020-05-23 NOTE — Progress Notes (Signed)
[facility] CONSULT NOTE  Patient Care Team: [name], MD as PCP - General (Family Medicine)  CHIEF COMPLAINTS/PURPOSE OF CONSULTATION:  Elevated total protein  ASSESSMENT & PLAN:  Elevated alkaline phosphatase level This is a very pleasant 45 year old male patient with past medical history significant for type 2 diabetes, hypertension, dyslipidemia referred for evaluation of elevated total protein, slightly elevated calcium and elevated alkaline phosphatase.  He had serum protein electrophoresis which did not show any evidence of monoclonal protein. Physical examination unremarkable, no palpable lymphadenopathy or hepatosplenomegaly. I reviewed his labs which showed slightly elevated total protein, albumin, slightly elevated alkaline phosphatase, normal creatinine and slightly elevated calcium. SPEP is negative for monoclonal protein.  UPEP, K/L ratio normal. We discussed about doing a bone survey since there is persistently elevated alk phosphatase. He was given the number to call to schedule his imaging. RTC in 4 weeks to review results If bone survey is normal, he can be discharged back to primary physician.  Family history of prostate cancer Family history of prostate cancer in dad, explained that his risk of prostate cancer is higher than general population because of first-degree relative with prostate cancer.  He undergoes digital rectal exam and PSA every year by his PCP.   Orders Placed This Encounter  Procedures  . DG Bone Survey Met    Standing Status:   Future    Standing Expiration Date:   05/23/2021    Order Specific Question:   Reason for Exam (SYMPTOM  OR DIAGNOSIS REQUIRED)    Answer:   staging myeloma    Order Specific Question:   Preferred imaging location?    Answer:   [facility]     HISTORY OF PRESENTING ILLNESS:   Anthony Arnold 45 y.o. male is here because of elevated total protein.  This is a very pleasant 45 year old  healthy male patient with a past medical history significant for type 2 diabetes mellitus, hypertension, dyslipidemia referred to hematology for some abnormal labs.  Mr. Keanon arrived to appointment today by himself.  He denies any new health complaints.  He is working on his diabetes control.  He may have lost some weight but gradually better control of his diabetes.  No unintentional weight loss.  No change in breathing.  He occasionally has some diarrhea from Metformin but otherwise no complaints.  No change in urinary habits.  No new neurological complaints. Rest of the pertinent 10 point ROS reviewed and negative.  Interval history  He is doing well. No complaints since last visit.  REVIEW OF SYSTEMS:   Constitutional: Denies fevers, chills or abnormal night sweats Eyes: Denies blurriness of vision, double vision or watery eyes Ears, nose, mouth, throat, and face: Denies mucositis or sore throat Respiratory: Denies cough, dyspnea or wheezes Cardiovascular: Denies palpitation, chest discomfort or lower extremity swelling Gastrointestinal:  Denies nausea, heartburn or change in bowel habits Skin: Denies abnormal skin rashes Lymphatics: Denies new lymphadenopathy or easy bruising Neurological:Denies numbness, tingling or new weaknesses Behavioral/Psych: Mood is stable, no new changes  All other systems were reviewed with the patient and are negative.  MEDICAL HISTORY:  Past Medical History:  Diagnosis Date  . Diabetes ([place])   . Dyslipidemia   . HTN (hypertension)     SURGICAL HISTORY: No past surgical history  SOCIAL HISTORY: Social History   Socioeconomic History  . Marital status: Married    Spouse name: Not on file  . Number of children: Not on file  . Years  of education: Not on file  . Highest education level: Not on file  Occupational History  . Not on file  Tobacco Use  . Smoking status: Never Smoker  . Smokeless tobacco: Never Used  Substance and Sexual  Activity  . Alcohol use: No    Alcohol/week: 0.0 standard drinks  . Drug use: Not on file  . Sexual activity: Not on file  Other Topics Concern  . Not on file  Social History Narrative  . Not on file   Social Determinants of Health   Financial Resource Strain: Not on file  Food Insecurity: Not on file  Transportation Needs: Not on file  Physical Activity: Not on file  Stress: Not on file  Social Connections: Not on file  Intimate Partner Violence: Not on file    FAMILY HISTORY: Family History  Problem Relation Age of Onset  . Diabetes Mother   . Diabetes Brother   Dad had prostate cancer. Her paternal aunt had breast cancer in her mid 65s. Paternal grandfather had melanoma.  ALLERGIES:  has No Known Allergies.  MEDICATIONS:  Current Outpatient Medications  Medication Sig Dispense Refill  . amLODipine (NORVASC) 5 MG tablet Take 5 mg by mouth daily.    Marland Kitchen aspirin 81 MG tablet Take 81 mg by mouth daily.    Marland Kitchen atorvastatin (LIPITOR) 80 MG tablet Take 80 mg by mouth daily. (Patient not taking: Reported on 05/09/2020)    . BD PEN NEEDLE NANO U/F 32G X 4 MM MISC USE ONCE DAILY (Patient not taking: Reported on 05/09/2020) 100 each 0  . glucose blood (ONE TOUCH ULTRA TEST) test strip 1 each by Other route 2 (two) times daily. And lancets 2/day 100 each 3  . Insulin Glargine (BASAGLAR KWIKPEN) 100 UNIT/ML SOPN Inject 0.45 mLs (45 Units total) into the skin every morning. And pen needles 1/day (Patient not taking: Reported on 05/09/2020) 15 pen 0  . Insulin Glargine (BASAGLAR KWIKPEN) 100 UNIT/ML SOPN INJECT 35 UNITS INTO THE SKIN EVERY MORNING. (Patient not taking: Reported on 05/09/2020) 15 mL 2  . Insulin Glargine (BASAGLAR KWIKPEN) 100 UNIT/ML SOPN INJECT 45 UNITSINTO THE SKIN EVERY MORNING (Patient not taking: Reported on 05/09/2020) 15 pen 4  . insulin lispro (HUMALOG KWIKPEN) 100 UNIT/ML KwikPen 5 to 8 units    . lisinopril (PRINIVIL,ZESTRIL) 10 MG tablet Take 10 mg by mouth daily.     . metFORMIN (GLUCOPHAGE-XR) 750 MG 24 hr tablet 1 tablet with evening meal    . rosuvastatin (CRESTOR) 20 MG tablet 1 tablet     No current facility-administered medications for this visit.    PHYSICAL EXAMINATION:  ECOG PERFORMANCE STATUS: 0 - Asymptomatic  VS and PE not done, telephone visit.  LABORATORY DATA:  I have reviewed the data as listed Lab Results  Component Value Date   WBC 6.8 05/09/2020   HGB 15.1 05/09/2020   HCT 46.1 05/09/2020   MCV 81.6 05/09/2020   PLT 232 05/09/2020     Chemistry      Component Value Date/Time   NA 138 05/09/2020 0956   K 4.1 05/09/2020 0956   CL 101 05/09/2020 0956   CO2 27 05/09/2020 0956   BUN 12 05/09/2020 0956   CREATININE 1.27 (H) 05/09/2020 0956      Component Value Date/Time   CALCIUM 9.9 05/09/2020 0956   ALKPHOS 168 (H) 05/09/2020 0956   AST 20 05/09/2020 0956   ALT 23 05/09/2020 0956   BILITOT 0.8 05/09/2020 0956  I reviewed available labs.  No elevation in creatinine.  Elevated total protein and albumin, mildly elevated calcium and elevated alkaline phosphatase.  SPEP without any monoclonal protein. UPEP neg K/L ratio normal CMP, slightly elevated alk phos CBC normal.  RADIOGRAPHIC STUDIES: I have personally reviewed the radiological images as listed and agreed with the findings in the report. No results found.  All questions were answered. The patient knows to call the clinic with any problems, questions or concerns. I spent 20 minutes in the care of this patient including history, review of records, counseling and coordination of care.     Benay Pike, MD 05/23/2020 4:31 PM

## 2020-05-23 NOTE — Assessment & Plan Note (Signed)
Family history of prostate cancer in dad, explained that his risk of prostate cancer is higher than general population because of first-degree relative with prostate cancer.  He undergoes digital rectal exam and PSA every year by his PCP.

## 2020-05-24 ENCOUNTER — Telehealth: Payer: Self-pay | Admitting: Hematology and Oncology

## 2020-05-24 NOTE — Telephone Encounter (Signed)
Scheduled appt per 3/30 sch msg. Pt aware.  

## 2020-06-07 ENCOUNTER — Ambulatory Visit (HOSPITAL_COMMUNITY)
Admission: RE | Admit: 2020-06-07 | Discharge: 2020-06-07 | Disposition: A | Discharge status: Home / Self Care | Payer: 59 | Source: Ambulatory Visit | Attending: Hematology and Oncology | Admitting: Hematology and Oncology

## 2020-06-07 ENCOUNTER — Other Ambulatory Visit: Payer: Self-pay

## 2020-06-07 DIAGNOSIS — R748 Abnormal levels of other serum enzymes: Secondary | ICD-10-CM | POA: Insufficient documentation

## 2020-06-18 NOTE — Progress Notes (Signed)
[name] CONSULT NOTE  Patient Care Team: [name], MD as PCP - General (Family Medicine)  CHIEF COMPLAINTS/PURPOSE OF CONSULTATION:  Elevated alkaline phosphatase, FU after imaging  ASSESSMENT & PLAN:  Elevated alkaline phosphatase level This is a very pleasant 45 year old male patient with past medical history significant for type 2 diabetes, hypertension, dyslipidemia referred for evaluation of elevated total protein, slightly elevated calcium and elevated alkaline phosphatase.  He had serum protein electrophoresis which did not show any evidence of monoclonal protein. No evidence of MM. Bone survey normal We will order Vit D level, TSH, GGT, US abdomen. PSA normal from March. If the above mentioned labs and imaging are normal, then we can continue FU annually. He is comfortable with the plan ROS and PE unremarkable.  Orders Placed This Encounter  Procedures  . US Abdomen Complete    Standing Status:   Future    Standing Expiration Date:   06/19/2021    Order Specific Question:   Reason for Exam (SYMPTOM  OR DIAGNOSIS REQUIRED)    Answer:   elevated alk phosphatase, unexplained    Order Specific Question:   Preferred imaging location?    Answer:   [facility]  . VITAMIN D 25 Hydroxy (Vit-D Deficiency, Fractures)    Standing Status:   Future    Standing Expiration Date:   06/19/2021  . TSH    Standing Status:   Standing    Number of Occurrences:   22    Standing Expiration Date:   06/19/2021  . Gamma GT    Standing Status:   Future    Standing Expiration Date:   06/19/2021  . CMP ([place] only)    Standing Status:   Future    Standing Expiration Date:   06/19/2021   Centralized scheduling number was provided to the patient to call and schedule his US abdomen.  HISTORY OF PRESENTING ILLNESS:   Anthony Arnold 45 y.o. male is here because of elevated total protein.  This is a very pleasant 45 year old healthy male patient with a past  medical history significant for type 2 diabetes mellitus, hypertension, dyslipidemia referred to hematology for some abnormal labs.    Interval history  He is doing well. No complaints since last visit. He is able to exercise daily No new bone pains No change in breathing, bowel habits or urinary habits. No interim infections, hospitalizations. He had history of Vit D deficiency, took supplementation last yr. Rest of the pertinent 10 point ROS reviewed and negative  REVIEW OF SYSTEMS:   Constitutional: Denies fevers, chills or abnormal night sweats Eyes: Denies blurriness of vision, double vision or watery eyes Ears, nose, mouth, throat, and face: Denies mucositis or sore throat Respiratory: Denies cough, dyspnea or wheezes Cardiovascular: Denies palpitation, chest discomfort or lower extremity swelling Gastrointestinal:  Denies nausea, heartburn or change in bowel habits Skin: Denies abnormal skin rashes Lymphatics: Denies new lymphadenopathy or easy bruising Neurological:Denies numbness, tingling or new weaknesses Behavioral/Psych: Mood is stable, no new changes  All other systems were reviewed with the patient and are negative.  MEDICAL HISTORY:  Past Medical History:  Diagnosis Date  . Diabetes ([place])   . Dyslipidemia   . HTN (hypertension)     SURGICAL HISTORY: No past surgical history  SOCIAL HISTORY: Social History   Socioeconomic History  . Marital status: Married    Spouse name: Not on file  . Number of children: Not on file  . Years of education: Not  on file  . Highest education level: Not on file  Occupational History  . Not on file  Tobacco Use  . Smoking status: Never Smoker  . Smokeless tobacco: Never Used  Substance and Sexual Activity  . Alcohol use: No    Alcohol/week: 0.0 standard drinks  . Drug use: Not on file  . Sexual activity: Not on file  Other Topics Concern  . Not on file  Social History Narrative  . Not on file   Social  Determinants of Health   Financial Resource Strain: Not on file  Food Insecurity: Not on file  Transportation Needs: Not on file  Physical Activity: Not on file  Stress: Not on file  Social Connections: Not on file  Intimate Partner Violence: Not on file    FAMILY HISTORY: Family History  Problem Relation Age of Onset  . Diabetes Mother   . Diabetes Brother   Dad had prostate cancer. Her paternal aunt had breast cancer in her mid 52s. Paternal grandfather had melanoma.  ALLERGIES:  has No Known Allergies.  MEDICATIONS:  Current Outpatient Medications  Medication Sig Dispense Refill  . amLODipine (NORVASC) 5 MG tablet Take 5 mg by mouth daily.    Marland Kitchen aspirin 81 MG tablet Take 81 mg by mouth daily.    Marland Kitchen atorvastatin (LIPITOR) 80 MG tablet Take 80 mg by mouth daily.    . BD PEN NEEDLE NANO U/F 32G X 4 MM MISC USE ONCE DAILY 100 each 0  . glucose blood (ONE TOUCH ULTRA TEST) test strip 1 each by Other route 2 (two) times daily. And lancets 2/day 100 each 3  . Insulin Glargine (BASAGLAR KWIKPEN) 100 UNIT/ML SOPN INJECT 35 UNITS INTO THE SKIN EVERY MORNING. (Patient taking differently: No sig reported) 15 mL 2  . insulin lispro (HUMALOG) 100 UNIT/ML KwikPen 5 to 8 units    . lisinopril (PRINIVIL,ZESTRIL) 10 MG tablet Take 10 mg by mouth daily.    . metFORMIN (GLUCOPHAGE-XR) 750 MG 24 hr tablet 1 tablet with evening meal    . rosuvastatin (CRESTOR) 20 MG tablet 1 tablet    . SEMAGLUTIDE,0.25 OR 0.5MG/DOS, [place] Inject 0.25 mg into the skin.     No current facility-administered medications for this visit.    PHYSICAL EXAMINATION:  ECOG PERFORMANCE STATUS: 0 - Asymptomatic  VS and PE not done, telephone visit.  LABORATORY DATA:  I have reviewed the data as listed Lab Results  Component Value Date   WBC 6.8 05/09/2020   HGB 15.1 05/09/2020   HCT 46.1 05/09/2020   MCV 81.6 05/09/2020   PLT 232 05/09/2020     Chemistry      Component Value Date/Time   NA 138 05/09/2020 0956    K 4.1 05/09/2020 0956   CL 101 05/09/2020 0956   CO2 27 05/09/2020 0956   BUN 12 05/09/2020 0956   CREATININE 1.27 (H) 05/09/2020 0956      Component Value Date/Time   CALCIUM 9.9 05/09/2020 0956   ALKPHOS 168 (H) 05/09/2020 0956   AST 20 05/09/2020 0956   ALT 23 05/09/2020 0956   BILITOT 0.8 05/09/2020 0956     I reviewed available labs.  No elevation in creatinine.  Elevated total protein and albumin, mildly elevated calcium and elevated alkaline phosphatase.  SPEP without any monoclonal protein. UPEP neg K/L ratio normal CMP, slightly elevated alk phos CBC normal.   FINDINGS: No lytic or sclerotic lesion is seen in the visualized skeleton, including the skull, spine, rib  cage, pelvis or extremities.  IMPRESSION: Negative.   Electronically Signed   By: Marijo Conception M.D.   On: 06/08/2020 10:31  RADIOGRAPHIC STUDIES: I have personally reviewed the radiological images as listed and agreed with the findings in the report. DG Bone Survey Met  Result Date: 06/08/2020 CLINICAL DATA:  Elevated alkaline phosphatase level. Staging myeloma. EXAM: METASTATIC BONE SURVEY COMPARISON:  None. FINDINGS: No lytic or sclerotic lesion is seen in the visualized skeleton, including the skull, spine, rib cage, pelvis or extremities. IMPRESSION: Negative. Electronically Signed   By: Marijo Conception M.D.   On: 06/08/2020 10:31    All questions were answered. The patient knows to call the clinic with any problems, questions or concerns. I spent 20 minutes in the care of this patient including history, review of records, counseling and coordination of care.     Benay Pike, MD 06/19/2020 9:55 AM

## 2020-06-19 ENCOUNTER — Other Ambulatory Visit: Payer: Self-pay

## 2020-06-19 ENCOUNTER — Encounter: Payer: Self-pay | Admitting: Hematology and Oncology

## 2020-06-19 ENCOUNTER — Inpatient Hospital Stay: Payer: 59 | Attending: Hematology and Oncology | Admitting: Hematology and Oncology

## 2020-06-19 ENCOUNTER — Inpatient Hospital Stay: Payer: 59

## 2020-06-19 ENCOUNTER — Other Ambulatory Visit: Payer: Self-pay | Admitting: Hematology and Oncology

## 2020-06-19 VITALS — BP 141/104 | HR 102 | Temp 96.5°F | Resp 17 | Wt 180.8 lb

## 2020-06-19 DIAGNOSIS — Z7984 Long term (current) use of oral hypoglycemic drugs: Secondary | ICD-10-CM | POA: Diagnosis not present

## 2020-06-19 DIAGNOSIS — E119 Type 2 diabetes mellitus without complications: Secondary | ICD-10-CM | POA: Insufficient documentation

## 2020-06-19 DIAGNOSIS — E8809 Other disorders of plasma-protein metabolism, not elsewhere classified: Secondary | ICD-10-CM

## 2020-06-19 DIAGNOSIS — E785 Hyperlipidemia, unspecified: Secondary | ICD-10-CM | POA: Diagnosis not present

## 2020-06-19 DIAGNOSIS — I10 Essential (primary) hypertension: Secondary | ICD-10-CM | POA: Diagnosis not present

## 2020-06-19 DIAGNOSIS — R748 Abnormal levels of other serum enzymes: Secondary | ICD-10-CM

## 2020-06-19 DIAGNOSIS — Z79899 Other long term (current) drug therapy: Secondary | ICD-10-CM | POA: Insufficient documentation

## 2020-06-19 DIAGNOSIS — Z8639 Personal history of other endocrine, nutritional and metabolic disease: Secondary | ICD-10-CM | POA: Insufficient documentation

## 2020-06-19 LAB — CBC WITH DIFFERENTIAL/PLATELET
Abs Immature Granulocytes: 0.01 10*3/uL (ref 0.00–0.07)
Basophils Absolute: 0.1 10*3/uL (ref 0.0–0.1)
Basophils Relative: 1 %
Eosinophils Absolute: 0.4 10*3/uL (ref 0.0–0.5)
Eosinophils Relative: 5 %
HCT: 44.8 % (ref 39.0–52.0)
Hemoglobin: 14.6 g/dL (ref 13.0–17.0)
Immature Granulocytes: 0 %
Lymphocytes Relative: 31 %
Lymphs Abs: 2.5 10*3/uL (ref 0.7–4.0)
MCH: 26.4 pg (ref 26.0–34.0)
MCHC: 32.6 g/dL (ref 30.0–36.0)
MCV: 81 fL (ref 80.0–100.0)
Monocytes Absolute: 0.6 10*3/uL (ref 0.1–1.0)
Monocytes Relative: 7 %
Neutro Abs: 4.5 10*3/uL (ref 1.7–7.7)
Neutrophils Relative %: 56 %
Platelets: 257 10*3/uL (ref 150–400)
RBC: 5.53 MIL/uL (ref 4.22–5.81)
RDW: 13.9 % (ref 11.5–15.5)
WBC: 8 10*3/uL (ref 4.0–10.5)
nRBC: 0 % (ref 0.0–0.2)

## 2020-06-19 LAB — TSH: TSH: 1.994 u[IU]/mL (ref 0.320–4.118)

## 2020-06-19 LAB — CMP (CANCER CENTER ONLY)
ALT: 20 U/L (ref 0–44)
AST: 20 U/L (ref 15–41)
Albumin: 4.7 g/dL (ref 3.5–5.0)
Alkaline Phosphatase: 160 U/L — ABNORMAL HIGH (ref 38–126)
Anion gap: 10 (ref 5–15)
BUN: 11 mg/dL (ref 6–20)
CO2: 27 mmol/L (ref 22–32)
Calcium: 9.9 mg/dL (ref 8.9–10.3)
Chloride: 104 mmol/L (ref 98–111)
Creatinine: 1.17 mg/dL (ref 0.61–1.24)
GFR, Estimated: >60 mL/min (ref >60)
Glucose, Bld: 138 mg/dL — ABNORMAL HIGH (ref 70–99)
Potassium: 4.2 mmol/L (ref 3.5–5.1)
Sodium: 141 mmol/L (ref 135–145)
Total Bilirubin: 0.7 mg/dL (ref 0.3–1.2)
Total Protein: 9.2 g/dL — ABNORMAL HIGH (ref 6.5–8.1)

## 2020-06-19 LAB — VITAMIN D 25 HYDROXY (VIT D DEFICIENCY, FRACTURES): Vit D, 25-Hydroxy: 7.06 ng/mL — ABNORMAL LOW (ref 30–100)

## 2020-06-19 LAB — GAMMA GT: GGT: 26 U/L (ref 7–50)

## 2020-06-19 MED ORDER — ERGOCALCIFEROL 1.25 MG (50000 UT) PO CAPS: 50000.0000 [IU] | ORAL_CAPSULE | ORAL | 0 refills | Status: DC

## 2020-06-19 NOTE — Assessment & Plan Note (Signed)
This is a very pleasant 45 year old male patient with past medical history significant for type 2 diabetes, hypertension, dyslipidemia referred for evaluation of elevated total protein, slightly elevated calcium and elevated alkaline phosphatase.  He had serum protein electrophoresis which did not show any evidence of monoclonal protein. No evidence of MM. Bone survey normal We will order Vit D level, TSH, GGT, US abdomen. PSA normal from March. If the above mentioned labs and imaging are normal, then we can continue FU annually. He is comfortable with the plan ROS and PE unremarkable.

## 2020-06-20 ENCOUNTER — Telehealth: Payer: Self-pay | Admitting: *Deleted

## 2020-06-20 NOTE — Telephone Encounter (Signed)
LM with note below 

## 2020-06-20 NOTE — Telephone Encounter (Signed)
-----   Message from Rachel Moulds, MD sent at 06/19/2020 10:59 PM EDT ----- Vit D is very low. He should start 40814 IU vit D weekly and FU with his PCP/endocrinologist who ever was following up with him for Vit D deficiency in the past. I sent the initial prescription.

## 2020-07-10 ENCOUNTER — Inpatient Hospital Stay: Payer: 59 | Attending: Hematology and Oncology | Admitting: Hematology and Oncology

## 2020-07-10 ENCOUNTER — Encounter: Payer: Self-pay | Admitting: Hematology and Oncology

## 2020-07-10 DIAGNOSIS — R748 Abnormal levels of other serum enzymes: Secondary | ICD-10-CM

## 2020-07-10 MED ORDER — ERGOCALCIFEROL 1.25 MG (50000 UT) PO CAPS: 50000.0000 [IU] | ORAL_CAPSULE | ORAL | 0 refills | Status: DC

## 2020-07-10 NOTE — Assessment & Plan Note (Addendum)
This is a very pleasant 45 year old male patient who was referred to [place] for slightly elevated total protein and alkaline phosphatase.  He did not have any complaints.  Physical examination was unremarkable during his previous visits.  We have evaluated him for multiple myeloma, no evidence of monoclonal protein in the serum, urine, kappa lambda ratio is normal.  Bone survey has been normal. We have then evaluated him for hypothyroidism, vitamin D deficiency, liver abnormalities.  He is here to discuss recommendations regarding the above-mentioned labs.  His labs are consistent with severe vitamin D deficiency, recommended supplementation with 50,000 international units once a week for 8 weeks, monitoring the vitamin D level at that point with his PCP or endocrinologist and supplement according to recommendations.  I wonder if his alkaline phosphatase is slightly elevated because of vitamin D deficiency which can occasionally lead to high bone turnover. With regards to his TSH, no evidence of hypo-/hyperthyroidism.  Normal GGT. Ultrasound abdomen pending, we will convey results to him. At this time since there is no clear evidence of malignancy or primary hematological issue, recommended that he follow-up with his PCP or endocrinologist once a year with regards to his elevated alkaline phosphatase.  He did have a PSA by Dr. Orland Mustard, PSA is normal.  He is welcome to follow-up with [place] on an as-needed basis.  He is very comfortable with these recommendations.  At this time there is appears to be no clear explanation for his slightly elevated total protein.  There however appears to be no evidence of albumin globulin imbalance. Thank you for consulting [place] in the care of this patient.  Please do not hesitate to contact us with any additional questions or concerns.

## 2020-07-10 NOTE — Progress Notes (Signed)
[name] CONSULT NOTE  Patient Care Team: [name], MD as PCP - General (Family Medicine)  CHIEF COMPLAINTS/PURPOSE OF CONSULTATION:  Elevated alkaline phosphatase, FU after imaging  ASSESSMENT & PLAN:   Elevated alkaline phosphatase level This is a very pleasant 45 year old male patient who was referred to [place] for slightly elevated total protein and alkaline phosphatase.  He did not have any complaints.  Physical examination was unremarkable during his previous visits.  We have evaluated him for multiple myeloma, no evidence of monoclonal protein in the serum, urine, kappa lambda ratio is normal.  Bone survey has been normal. We have then evaluated him for hypothyroidism, vitamin D deficiency, liver abnormalities.  He is here to discuss recommendations regarding the above-mentioned labs.  His labs are consistent with severe vitamin D deficiency, recommended supplementation with 50,000 international units once a week for 8 weeks, monitoring the vitamin D level at that point with his PCP or endocrinologist and supplement according to recommendations.  I wonder if his alkaline phosphatase is slightly elevated because of vitamin D deficiency which can occasionally lead to high bone turnover. With regards to his TSH, no evidence of hypo-/hyperthyroidism.  Normal GGT. Ultrasound abdomen pending, we will convey results to him. At this time since there is no clear evidence of malignancy or primary hematological issue, recommended that he follow-up with his PCP or endocrinologist once a year with regards to his elevated alkaline phosphatase.  He did have a PSA by Dr. Orland Mustard, PSA is normal.  He is welcome to follow-up with [place] on an as-needed basis.  He is very comfortable with these recommendations.  At this time there is appears to be no clear explanation for his slightly elevated total protein.  There however appears to be no evidence of albumin globulin imbalance. Thank you for  consulting [place] in the care of this patient.  Please do not hesitate to contact us with any additional questions or concerns.  No orders of the defined types were placed in this encounter.    HISTORY OF PRESENTING ILLNESS:   Anthony Arnold 45 y.o. male is here because of elevated total protein and alkaline phosphatase.  Interval history  Patient is here for a telephone visit to follow-up on his labs from April 2022.  Since last visit, he denies any new health complaints.  He has been doing very well.  He has not started taking the vitamin D supplementation yet.  He sees he does not use [facility] anymore.  He does recollect having severe vitamin D deficiency in the past, he was following up with an endocrinologist.  Rest of the pertinent 10 point ROS reviewed and negative.   MEDICAL HISTORY:  Past Medical History:  Diagnosis Date  . Diabetes ([place])   . Dyslipidemia   . HTN (hypertension)     SURGICAL HISTORY: No past surgical history  SOCIAL HISTORY: Social History   Socioeconomic History  . Marital status: Married    Spouse name: Not on file  . Number of children: Not on file  . Years of education: Not on file  . Highest education level: Not on file  Occupational History  . Not on file  Tobacco Use  . Smoking status: Never Smoker  . Smokeless tobacco: Never Used  Substance and Sexual Activity  . Alcohol use: No    Alcohol/week: 0.0 standard drinks  . Drug use: Not on file  . Sexual activity: Not on file  Other Topics Concern  . Not on  file  Social History Narrative  . Not on file   Social Determinants of Health   Financial Resource Strain: Not on file  Food Insecurity: Not on file  Transportation Needs: Not on file  Physical Activity: Not on file  Stress: Not on file  Social Connections: Not on file  Intimate Partner Violence: Not on file    FAMILY HISTORY: Family History  Problem Relation Age of Onset  . Diabetes Mother   . Diabetes Brother   Dad  had prostate cancer. Her paternal aunt had breast cancer in her mid 65s. Paternal grandfather had melanoma.  ALLERGIES:  has No Known Allergies.  MEDICATIONS:  Current Outpatient Medications  Medication Sig Dispense Refill  . amLODipine (NORVASC) 5 MG tablet Take 5 mg by mouth daily.    Marland Kitchen aspirin 81 MG tablet Take 81 mg by mouth daily.    Marland Kitchen atorvastatin (LIPITOR) 80 MG tablet Take 80 mg by mouth daily.    . BD PEN NEEDLE NANO U/F 32G X 4 MM MISC USE ONCE DAILY 100 each 0  . glucose blood (ONE TOUCH ULTRA TEST) test strip 1 each by Other route 2 (two) times daily. And lancets 2/day 100 each 3  . Insulin Glargine (BASAGLAR KWIKPEN) 100 UNIT/ML SOPN INJECT 35 UNITS INTO THE SKIN EVERY MORNING. (Patient taking differently: No sig reported) 15 mL 2  . insulin lispro (HUMALOG) 100 UNIT/ML KwikPen 5 to 8 units    . JARDIANCE 25 MG TABS tablet Take 25 mg by mouth daily.    Marland Kitchen lisinopril (PRINIVIL,ZESTRIL) 10 MG tablet Take 10 mg by mouth daily.    . metFORMIN (GLUCOPHAGE-XR) 750 MG 24 hr tablet 1 tablet with evening meal    . rosuvastatin (CRESTOR) 20 MG tablet 1 tablet    . SEMAGLUTIDE,0.25 OR 0.5MG/DOS, [place] Inject 0.25 mg into the skin.    Marland Kitchen ergocalciferol (VITAMIN D2) 1.25 MG (50000 UT) capsule Take 1 capsule (50,000 Units total) by mouth once a week. 8 capsule 0   No current facility-administered medications for this visit.    PHYSICAL EXAMINATION:  ECOG PERFORMANCE STATUS: 0 - Asymptomatic  Physical examination vital signs not done, telephone visit  LABORATORY DATA:  I have reviewed the data as listed Lab Results  Component Value Date   WBC 8.0 06/19/2020   HGB 14.6 06/19/2020   HCT 44.8 06/19/2020   MCV 81.0 06/19/2020   PLT 257 06/19/2020     Chemistry      Component Value Date/Time   NA 141 06/19/2020 1108   K 4.2 06/19/2020 1108   CL 104 06/19/2020 1108   CO2 27 06/19/2020 1108   BUN 11 06/19/2020 1108   CREATININE 1.17 06/19/2020 1108      Component Value Date/Time    CALCIUM 9.9 06/19/2020 1108   ALKPHOS 160 (H) 06/19/2020 1108   AST 20 06/19/2020 1108   ALT 20 06/19/2020 1108   BILITOT 0.7 06/19/2020 1108     I reviewed available labs.  No elevation in creatinine.  Elevated total protein and albumin, mildly elevated calcium and elevated alkaline phosphatase.  SPEP without any monoclonal protein. UPEP neg K/L ratio normal CMP, slightly elevated alk phos CBC normal. He does have severe vitamin D deficiency and will need vitamin D supplementation.  TSH is normal, GGT is normal, CBC normal.  CMP with slightly elevated total protein and alkaline phosphatase.   Bone survey without any lytic lesions. I ordered an ultrasound abdomen, gave him the central radiology number to  schedule this, this has been scheduled for 5/23.  RADIOGRAPHIC STUDIES: I have personally reviewed the radiological images as listed and agreed with the findings in the report. No results found.  All questions were answered. The patient knows to call the clinic with any problems, questions or concerns.  I connected with  Anthony Arnold on 07/10/20 by telephone application and verified that I am speaking with the correct person using two identifiers.   I discussed the limitations of evaluation and management by telemedicine. The patient expressed understanding and agreed to proceed.   I spent 15 minutes in the care of this patient reviewing records, discussing about possible vitamin D deficiency leading to increased bone turnover and slightly elevated alkaline phosphatase, need for vitamin D supplementation, follow-up with endocrinology and return to hematology as needed.     Benay Pike, MD 07/10/2020 3:01 PM

## 2020-07-16 ENCOUNTER — Ambulatory Visit (HOSPITAL_COMMUNITY): Payer: 59

## 2021-06-30 NOTE — Progress Notes (Signed)
?Cardiology Office Note:   ? ?Date:  07/05/2021  ? ?IDIkie Arnold, DOB [birth year], MRN [id number] ? ?PCP:  [name], MD  ?Cardiologist:  Sinclair Grooms, MD  ? ?Referring MD: [name], MD  ? ?Chief Complaint  ?Patient presents with  ? Advice Only  ?  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 ? ?History of Present Illness:   ? ?Anthony Arnold is a 46 y.o. male with a hx of DM II, primary hypertension, and dyslipidemia being seen for chest pain. ? ? ?The major complaint that led to referral is brief intermittent right parasternal discomfort.  "It feels that the muscles and bones are sore".  There is palpable tenderness at times.  The discomfort is not precipitated by physical activity.  There is no radiation or associated shortness of breath.  He denies orthopnea. ? ?He snores loudly and knows this because his wife pointed out to him.  He has frequent nocturnal urination. ? ?He walks about 30 minutes most days without limitations. ? ?He does not smoke cigarettes.  He is not compliant with medication regimen because he forgets.  He admits to dietary indiscretion particularly carbohydrates. ? ?The hemoglobin A1c is 14.5; total cholesterol is 326 on rosuvastatin, LDL cholesterol 224 on rosuvastatin, and blood pressure is 130/100 mmHg. ? ?Family history significant for vascular disease on his mother side.  She was on dialysis, had MI, high blood pressure, and CHF.  A sister has died of an intracerebral hemorrhage.  And not has had myocardial infarction as have his uncle and his grandfather. ? ?Past Medical History:  ?Diagnosis Date  ? Diabetes ([place])   ? Dyslipidemia   ? HTN (hypertension)   ? ? ?History reviewed. No pertinent surgical history. ? ?Current Medications: ?Current Meds  ?Medication Sig  ? amLODipine (NORVASC) 5 MG tablet Take 5 mg by mouth daily.  ? aspirin 81 MG tablet Take 81 mg by mouth daily.  ?  atorvastatin (LIPITOR) 80 MG tablet Take 80 mg by mouth daily.  ? BD PEN NEEDLE NANO U/F 32G X 4 MM MISC USE ONCE DAILY  ? glucose blood (ONE TOUCH ULTRA TEST) test strip 1 each by Other route 2 (two) times daily. And lancets 2/day  ? Insulin Glargine (BASAGLAR KWIKPEN) 100 UNIT/ML SOPN INJECT 35 UNITS INTO THE SKIN EVERY MORNING. (Patient taking differently: No sig reported)  ? insulin lispro (HUMALOG) 100 UNIT/ML KwikPen 5 to 8 units  ? JARDIANCE 25 MG TABS tablet Take 25 mg by mouth daily.  ? lisinopril (PRINIVIL,ZESTRIL) 10 MG tablet Take 10 mg by mouth daily.  ? metFORMIN (GLUCOPHAGE-XR) 750 MG 24 hr tablet Take 1 tablet by mouth in the morning and at bedtime.  ? rosuvastatin (CRESTOR) 20 MG tablet 1 tablet  ? SEMAGLUTIDE,0.25 OR 0.5MG /DOS, [place] Inject 0.25 mg into the skin.  ? [DISCONTINUED] metFORMIN (GLUCOPHAGE-XR) 750 MG 24 hr tablet 1 tablet with evening meal  ?  ? ?Allergies:   Patient has no known allergies.  ? ?Social History  ? ?Socioeconomic History  ? Marital status: Married  ?  Spouse name: Not on file  ? Number of children: Not on file  ? Years of education: Not on file  ? Highest education level: Not on file  ?Occupational History  ? Not on file  ?Tobacco Use  ? Smoking status: Never  ? Smokeless tobacco: Never  ?Substance and Sexual Activity  ? Alcohol use: No  ?  Alcohol/week: 0.0 standard drinks  ? Drug use: Not on file  ? Sexual activity: Not on file  ?Other Topics Concern  ? Not on file  ?Social History Narrative  ? Not on file  ? ?Social Determinants of Health  ? ?Financial Resource Strain: Not on file  ?Food Insecurity: Not on file  ?Transportation Needs: Not on file  ?Physical Activity: Not on file  ?Stress: Not on file  ?Social Connections: Not on file  ?  ? ?Family History: ?The patient's family history includes Diabetes in his brother and mother. ? ?ROS:   ?Please see the history of present illness.    ?[place].  All other systems reviewed and are negative. ? ?EKGs/Labs/Other Studies  Reviewed:   ? ?The following studies were reviewed today: ?Laboratory data: 3/30 /2023 ?Hemoglobin A1c 14.6 ?LDL cholesterol 224 ? ?EKG:  EKG performed on 07/05/2021, sinus rhythm with prominent voltage concerning for LVH.  Biatrial abnormality.  Interventricular conduction delay.  Nonspecific T wave flattening. ? ?Recent Labs: ?No results found for requested labs within last 8760 hours.  ?Recent Lipid Panel ?No results found for: CHOL, TRIG, HDL, CHOLHDL, VLDL, LDLCALC, LDLDIRECT ? ?Physical Exam:   ? ?VS:  BP (!) 132/98   Pulse 98   Ht 5\' 9"  (1.753 m)   Wt 177 lb 3.2 oz (80.4 kg)   SpO2 99%   BMI 26.17 kg/m?    ? ?Wt Readings from Last 3 Encounters:  ?07/05/21 177 lb 3.2 oz (80.4 kg)  ?06/19/20 180 lb 12.8 oz (82 kg)  ?05/09/20 181 lb (82.1 kg)  ?  ? ?GEN: Healthy appearing. No acute distress ?HEENT: Normal ?NECK: No JVD. ?LYMPHATICS: No lymphadenopathy ?CARDIAC: No murmur. RRR S4 but no S3 gallop, or edema. ?VASCULAR:  Normal Pulses. No bruits. ?RESPIRATORY:  Clear to auscultation without rales, wheezing or rhonchi  ?ABDOMEN: Soft, non-tender, non-distended, No pulsatile mass, ?MUSCULOSKELETAL: No deformity  ?SKIN: Warm and dry ?NEUROLOGIC:  Alert and oriented x 3 ?PSYCHIATRIC:  Normal affect  ? ?ASSESSMENT:   ? ?1. Chest pain of uncertain etiology   ?2. Dyslipidemia   ?3. Type 2 diabetes mellitus without complication, with long-term current use of insulin ([place])   ?4. Primary hypertension   ?5. Snoring   ? ?PLAN:   ? ?In order of problems listed above: ? ?Probably musculoskeletal in origin.  We will get a coronary calcium score performed.  This will help identify if there is any significant atherosclerotic disease burden. ?Hyperlipidemia needs to be more effectively treated.  Will likely need to be referred to the hyperlipidemia clinic. ?He is on SGLT2 and GLP-1 therapy and still has A1c greater than 14.  Serious consideration to dietary changes is discussed. ?Will be unlikely to control blood pressure  without adding a diuretic.  We will see what the echo looks like first.  I suspect he will have significant left ventricular hypertrophy.  2D Doppler echocardiogram. ?If blood pressure remains recalcitrant to therapy, he should have a sleep study to rule out sleep apnea given his history of snoring. ? ?Overall education and awareness concerning secondary risk prevention was discussed in detail: LDL less than 70, hemoglobin A1c less than 7, blood pressure target less than 130/80 mmHg, >150 minutes of moderate aerobic activity per week, avoidance of smoking, weight control (via diet and exercise), and continued surveillance/management of/for obstructive sleep apnea. ? ? ?He will need a 67-month follow-up for coaching and also reassessment on medication changes that are likely to occur after data is returned  from this visit. ? ? ? ?Medication Adjustments/Labs and Tests Ordered: ?Current medicines are reviewed at length with the patient today.  Concerns regarding medicines are outlined above.  ?Orders Placed This Encounter  ?Procedures  ? CT CARDIAC SCORING  ? EKG 12-Lead  ? ECHOCARDIOGRAM COMPLETE  ? ?No orders of the defined types were placed in this encounter. ? ? ?Patient Instructions  ?Medication Instructions:  ?Your physician recommends that you continue on your current medications as directed. Please refer to the Current Medication list given to you today. ? ?*If you need a refill on your cardiac medications before your next appointment, please call your pharmacy* ? ?Lab Work: ?NONE ? ?Testing/Procedures: ?Your physician has requested you to have a Coronary Calcium Score performed. ? ?Your physician has requested that you have an echocardiogram. Echocardiography is a painless test that uses sound waves to create images of your heart. It provides your doctor with information about the size and shape of your heart and how well your heart?s chambers and valves are working. This procedure takes approximately one hour.  There are no restrictions for this procedure. ? ?Follow-Up: ?At [facility], you and your health needs are our priority.  As part of our continuing mission to provide you with exceptional heart care, we have created design

## 2021-07-05 ENCOUNTER — Ambulatory Visit (INDEPENDENT_AMBULATORY_CARE_PROVIDER_SITE_OTHER): Payer: 59 | Admitting: Interventional Cardiology

## 2021-07-05 ENCOUNTER — Encounter: Payer: Self-pay | Admitting: Interventional Cardiology

## 2021-07-05 VITALS — BP 132/98 | HR 98 | Ht 69.0 in | Wt 177.2 lb

## 2021-07-05 DIAGNOSIS — R079 Chest pain, unspecified: Secondary | ICD-10-CM | POA: Diagnosis not present

## 2021-07-05 DIAGNOSIS — E119 Type 2 diabetes mellitus without complications: Secondary | ICD-10-CM

## 2021-07-05 DIAGNOSIS — E785 Hyperlipidemia, unspecified: Secondary | ICD-10-CM

## 2021-07-05 DIAGNOSIS — I10 Essential (primary) hypertension: Secondary | ICD-10-CM | POA: Diagnosis not present

## 2021-07-05 DIAGNOSIS — R0683 Snoring: Secondary | ICD-10-CM

## 2021-07-05 DIAGNOSIS — Z794 Long term (current) use of insulin: Secondary | ICD-10-CM

## 2021-07-05 NOTE — Patient Instructions (Signed)
Medication Instructions:  ?Your physician recommends that you continue on your current medications as directed. Please refer to the Current Medication list given to you today. ? ?*If you need a refill on your cardiac medications before your next appointment, please call your pharmacy* ? ?Lab Work: ?NONE ? ?Testing/Procedures: ?Your physician has requested you to have a Coronary Calcium Score performed. ? ?Your physician has requested that you have an echocardiogram. Echocardiography is a painless test that uses sound waves to create images of your heart. It provides your doctor with information about the size and shape of your heart and how well your heart?s chambers and valves are working. This procedure takes approximately one hour. There are no restrictions for this procedure. ? ?Follow-Up: ?At [facility], you and your health needs are our priority.  As part of our continuing mission to provide you with exceptional heart care, we have created designated Provider Care Teams.  These Care Teams include your primary Cardiologist (physician) and Advanced Practice Providers (APPs -  Physician Assistants and Nurse Practitioners) who all work together to provide you with the care you need, when you need it. ? ?Your next appointment:   ?3 month(s) ? ?The format for your next appointment:   ?In Person ? ?Provider:   ?Sinclair Grooms, MD { ? ? ?Important Information About Sugar ? ? ? ? ?  ?

## 2021-07-12 ENCOUNTER — Telehealth: Payer: Self-pay | Admitting: Interventional Cardiology

## 2021-07-12 NOTE — Telephone Encounter (Signed)
Patient called stating nurse called him yesterday,  he is returning the call.  He gives permission to leave a voice mail.

## 2021-07-12 NOTE — Telephone Encounter (Signed)
Anthony Records, MD  07/10/2021  9:57 AM EDT     Let the patient know labs from [place] were reviewed.  I am very concerned about the cholesterol level.  We do need additional therapy intensification.  We will await the calcium score before making final recommendation.  We will likely need lipid clinic referral. A copy will be sent to Patrcia Dolly, DO   Spoke with the patient and gave recommendations from Dr. Katrinka Blazing. Patient verbalized understanding.

## 2021-07-26 ENCOUNTER — Other Ambulatory Visit (HOSPITAL_COMMUNITY): Payer: 59

## 2021-07-26 ENCOUNTER — Other Ambulatory Visit: Payer: 59

## 2021-08-09 ENCOUNTER — Ambulatory Visit (HOSPITAL_COMMUNITY): Payer: 59 | Attending: Cardiology

## 2021-08-09 ENCOUNTER — Ambulatory Visit
Admission: RE | Admit: 2021-08-09 | Discharge: 2021-08-09 | Disposition: A | Discharge status: Home / Self Care | Payer: Self-pay | Source: Ambulatory Visit | Attending: Interventional Cardiology | Admitting: Interventional Cardiology

## 2021-08-09 DIAGNOSIS — I10 Essential (primary) hypertension: Secondary | ICD-10-CM | POA: Diagnosis present

## 2021-08-09 DIAGNOSIS — E785 Hyperlipidemia, unspecified: Secondary | ICD-10-CM

## 2021-08-09 LAB — ECHOCARDIOGRAM COMPLETE
Area-P 1/2: 4.49 cm2
S' Lateral: 3.5 cm

## 2021-08-16 ENCOUNTER — Telehealth: Payer: Self-pay | Admitting: Interventional Cardiology

## 2021-08-16 MED ORDER — CARVEDILOL 12.5 MG PO TABS: 12.5000 mg | ORAL_TABLET | Freq: Two times a day (BID) | ORAL | 3 refills | Status: AC

## 2021-08-16 MED ORDER — SACUBITRIL-VALSARTAN 24-26 MG PO TABS: 1.0000 | ORAL_TABLET | Freq: Two times a day (BID) | ORAL | 11 refills | Status: AC

## 2021-08-16 MED ORDER — SPIRONOLACTONE 25 MG PO TABS: 12.5000 mg | ORAL_TABLET | ORAL | 3 refills | Status: AC

## 2021-08-20 ENCOUNTER — Encounter: Payer: Self-pay | Admitting: Interventional Cardiology

## 2021-08-20 DIAGNOSIS — I10 Essential (primary) hypertension: Secondary | ICD-10-CM

## 2021-08-30 ENCOUNTER — Other Ambulatory Visit: Payer: 59 | Admitting: *Deleted

## 2021-08-30 DIAGNOSIS — I10 Essential (primary) hypertension: Secondary | ICD-10-CM

## 2021-09-02 LAB — BASIC METABOLIC PANEL
BUN/Creatinine Ratio: 11 (ref 9–20)
BUN: 12 mg/dL (ref 6–24)
CO2: 28 mmol/L (ref 20–29)
Calcium: 10.5 mg/dL — ABNORMAL HIGH (ref 8.7–10.2)
Chloride: 99 mmol/L (ref 96–106)
Creatinine, Ser: 1.06 mg/dL (ref 0.76–1.27)
Glucose: 187 mg/dL — ABNORMAL HIGH (ref 70–99)
Potassium: 4.5 mmol/L (ref 3.5–5.2)
Sodium: 136 mmol/L (ref 134–144)
eGFR: 87 mL/min/{1.73_m2} (ref >59)

## 2021-09-03 ENCOUNTER — Encounter: Payer: Self-pay | Admitting: Interventional Cardiology

## 2021-11-29 ENCOUNTER — Ambulatory Visit: Payer: 59 | Admitting: Interventional Cardiology

## 2022-01-20 ENCOUNTER — Telehealth: Payer: Self-pay

## 2022-01-20 NOTE — Telephone Encounter (Signed)
**Note De-Identified Arion Shankles Obfuscation** [employer] PA started through covermymeds. Key: [id number]

## 2022-02-04 ENCOUNTER — Ambulatory Visit: Payer: 59 | Admitting: Interventional Cardiology

## 2023-02-20 ENCOUNTER — Other Ambulatory Visit: Payer: Self-pay | Admitting: Family Medicine

## 2023-02-20 DIAGNOSIS — R7989 Other specified abnormal findings of blood chemistry: Secondary | ICD-10-CM

## 2023-03-06 ENCOUNTER — Ambulatory Visit
Admission: RE | Admit: 2023-03-06 | Discharge: 2023-03-06 | Disposition: A | Discharge status: Home / Self Care | Payer: 59 | Source: Ambulatory Visit | Attending: Family Medicine | Admitting: Family Medicine

## 2023-03-06 DIAGNOSIS — R7989 Other specified abnormal findings of blood chemistry: Secondary | ICD-10-CM

## 2023-04-14 IMAGING — CT CT CARDIAC CORONARY ARTERY CALCIUM SCORE
3 series · 13 of 20 positions shown, 15 images · non-contrast
Comparison: None.

Addendum:
CLINICAL DATA: Cardiovascular Disease Risk stratification

EXAM:
Coronary Calcium Score
TECHNIQUE: A gated, non-contrast computed tomography scan of the heart was
performed using 3mm slice thickness. Axial images were analyzed on a
dedicated workstation. Calcium scoring of the coronary arteries was
performed using the Agatston method.

[Series 2: cascseq 2.0 sa36 (id) (id) · axial · 0.39mm/px · z∈[-220,-140]mm · 4 of 68 slices shown]
[im 14/68  vessel]
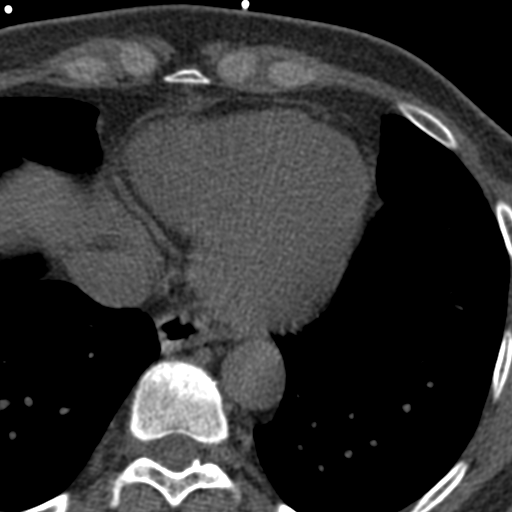
[im 27/68  vessel]
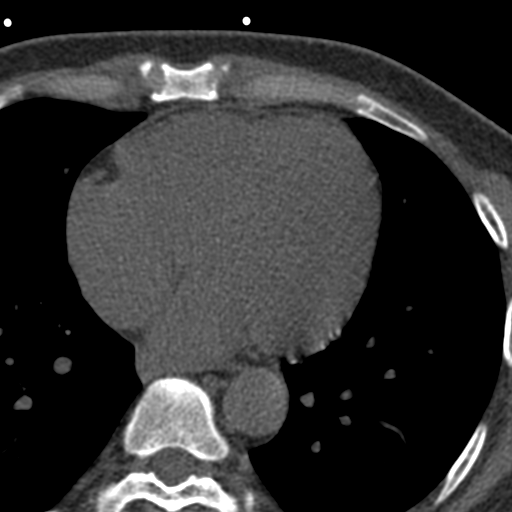
[im 41/68  vessel]
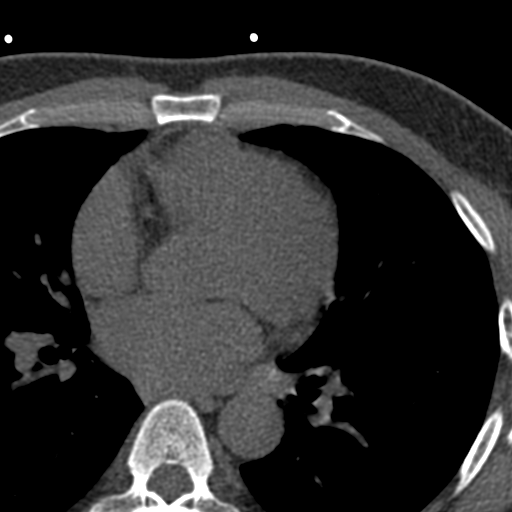
[im 54/68  vessel]
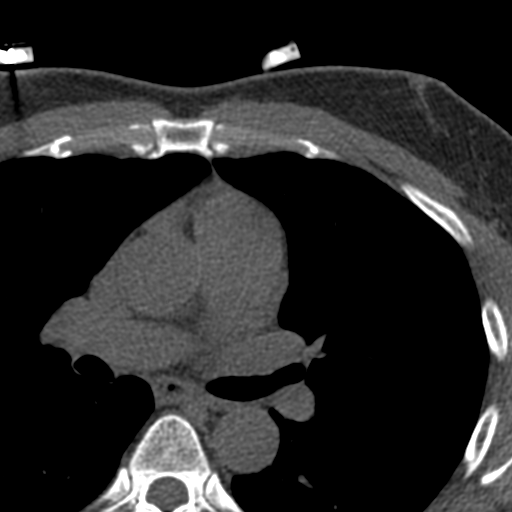

[Series 3: cascseq 2.0 bf37 st · axial · 0.73mm/px · z∈[-224,-136]mm · 5 of 68 slices shown, 7 images]
[im 12/68  vessel]
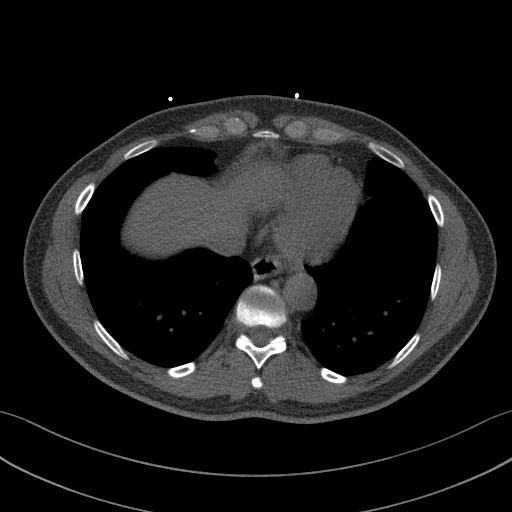
[im 12/68  lung]
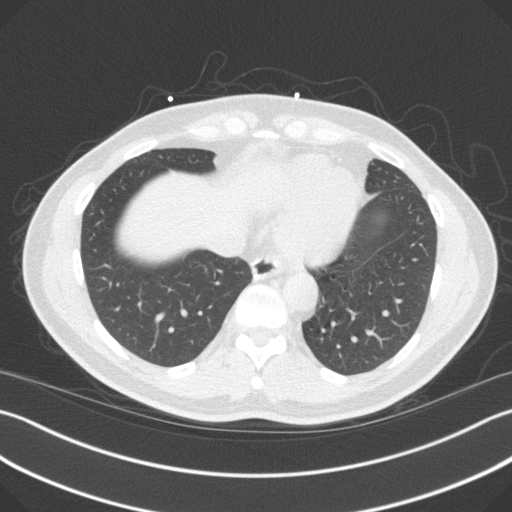
[im 23/68  vessel]
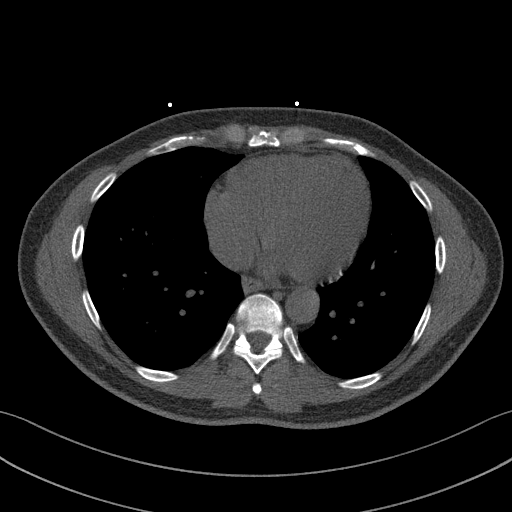
[im 34/68  vessel]
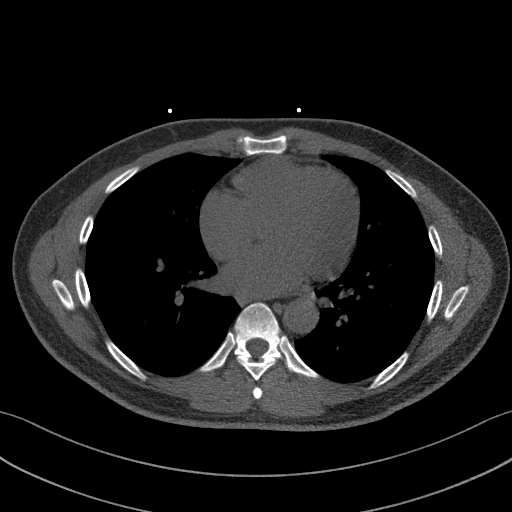
[im 45/68  vessel]
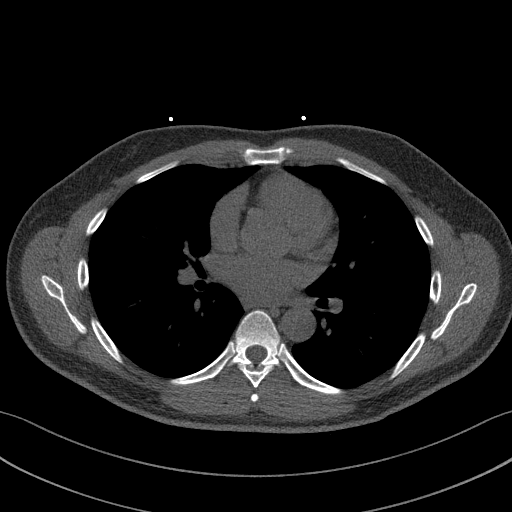
[im 56/68  vessel]
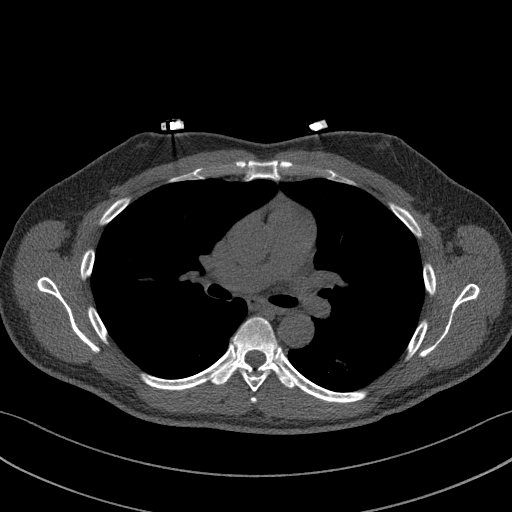
[im 56/68  lung]
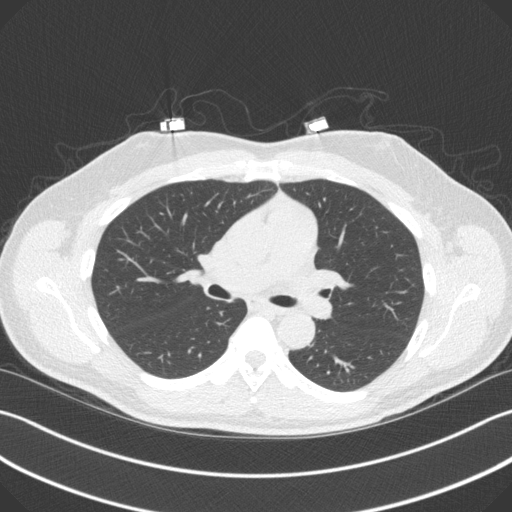

[Series 4: cascseq 2.0 br59 lung · axial · 0.73mm/px · z∈[-224,-158]mm · 4 of 68 slices shown]
[im 12/68  lung]
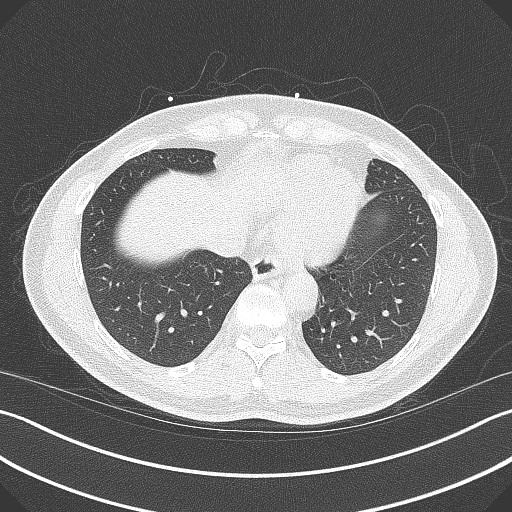
[im 23/68  lung]
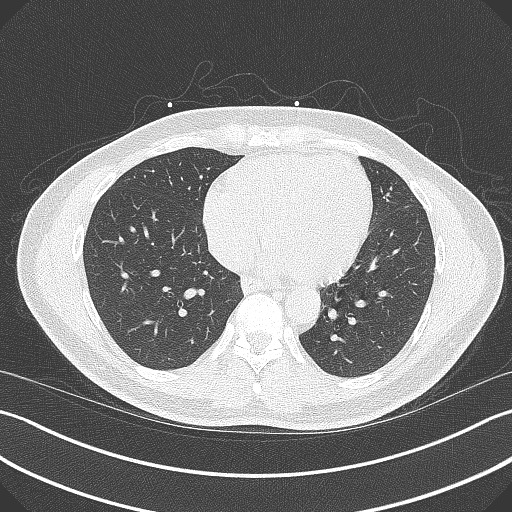
[im 34/68  lung]
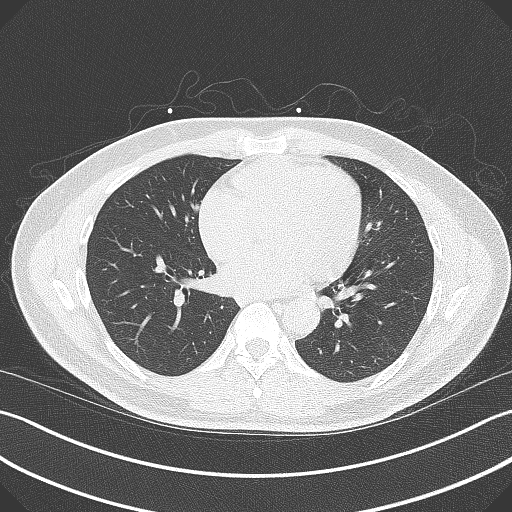
[im 45/68  lung]
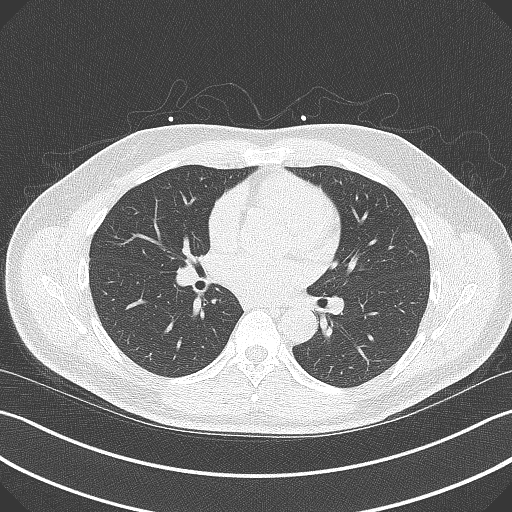

[13 of 20 positions shown; findings below may reference images not displayed]

FINDINGS: Coronary arteries: Normal origins.

Coronary Calcium Score:

Left main: 0

Left anterior descending artery: 0

Left circumflex artery: 0

Right coronary artery: 0

Total: 0

Percentile: 0

Pericardium: Normal.

Aorta: Normal caliber.

Non-cardiac: See separate report from [REDACTED].
IMPRESSION: Coronary calcium score of 0. This is a low risk study.



If CAC=0, it is reasonable to withhold statin therapy and reassess
in 5 to 10 years, as long as higher risk conditions are absent
(diabetes mellitus, family history of premature CHD in first degree
relatives (males <55 years; females <65 years), cigarette smoking,
or LDL >=190 mg/dL).

If CAC is 1 to 99, it is reasonable to initiate statin therapy for
patients >=55 years of age.

If CAC is >=100 or >=75th percentile, it is reasonable to initiate
statin therapy at any age.

Cardiology referral should be considered for patients with CAC
scores >=400 or >=75th percentile.

*2741 AHA/ACC/AACVPR/AAPA/ABC/FIDEL/TALE/OSTERHOUT/[place]
Guideline on the Management of Blood Cholesterol: A Report of the
American College of Cardiology/American Heart Association Task Force
on Clinical Practice Guidelines. J Am Coll Cardiol.
1878;73(24):7743-7102.

EXAM:
OVER-READ INTERPRETATION  CT CHEST

The following report is an over-read performed by radiologist Dr.
does not include interpretation of cardiac or coronary anatomy or
pathology. The coronary calcium score interpretation by the
cardiologist is attached.
FINDINGS: Cardiovascular: Normal heart size. No significant pericardial
effusion/thickening. Great vessels are normal in course and caliber.

Mediastinum/Nodes: Unremarkable esophagus. No pathologically
enlarged mediastinal or hilar lymph nodes, noting limited
sensitivity for the detection of hilar adenopathy on this
noncontrast study.

Lungs/Pleura: No pneumothorax. No pleural effusion. Anterior left
lower lobe solid 0.4 cm pulmonary nodule (series 4/image 22). No
acute consolidative airspace disease, lung masses or additional
significant pulmonary nodules.

Upper abdomen: No acute abnormality.

Musculoskeletal:  No aggressive appearing focal osseous lesions.
IMPRESSION: 1. Solitary 0.4 cm solid left lower lobe pulmonary nodule. No
follow-up needed if patient is low-risk.This recommendation follows
the consensus statement: Guidelines for Management of Incidental
Pulmonary Nodules Detected on CT Images: From the [HOSPITAL]
2. No additional significant extracardiac findings.

*** End of Addendum ***
FINDINGS: Coronary arteries: Normal origins.

Coronary Calcium Score:

Left main: 0

Left anterior descending artery: 0

Left circumflex artery: 0

Right coronary artery: 0

Total: 0

Percentile: 0

Pericardium: Normal.

Aorta: Normal caliber.

Non-cardiac: See separate report from [REDACTED].
IMPRESSION: Coronary calcium score of 0. This is a low risk study.



If CAC=0, it is reasonable to withhold statin therapy and reassess
in 5 to 10 years, as long as higher risk conditions are absent
(diabetes mellitus, family history of premature CHD in first degree
relatives (males <55 years; females <65 years), cigarette smoking,
or LDL >=190 mg/dL).

If CAC is 1 to 99, it is reasonable to initiate statin therapy for
patients >=55 years of age.

If CAC is >=100 or >=75th percentile, it is reasonable to initiate
statin therapy at any age.

Cardiology referral should be considered for patients with CAC
scores >=400 or >=75th percentile.

*2741 AHA/ACC/AACVPR/AAPA/ABC/FIDEL/TALE/OSTERHOUT/[place]
Guideline on the Management of Blood Cholesterol: A Report of the
American College of Cardiology/American Heart Association Task Force
on Clinical Practice Guidelines. J Am Coll Cardiol.
1878;73(24):7743-7102.
